# Patient Record
Sex: Male | Born: 1970 | Race: White | Hispanic: No | Smoking: Never smoker
Health system: Southern US, Community
[De-identification: ages and names within clinical notes are randomized; demographics above are authoritative.]

## PROBLEM LIST (undated history)

## (undated) DIAGNOSIS — Z789 Other specified health status: Secondary | ICD-10-CM

## (undated) DIAGNOSIS — K219 Gastro-esophageal reflux disease without esophagitis: Secondary | ICD-10-CM

## (undated) DIAGNOSIS — K859 Acute pancreatitis without necrosis or infection, unspecified: Secondary | ICD-10-CM

## (undated) HISTORY — DX: Acute pancreatitis without necrosis or infection, unspecified: K85.90

## (undated) HISTORY — PX: NO PAST SURGERIES: SHX2092

## (undated) HISTORY — DX: Gastro-esophageal reflux disease without esophagitis: K21.9

---

## 2014-04-24 ENCOUNTER — Encounter (HOSPITAL_COMMUNITY): Payer: Self-pay | Admitting: Emergency Medicine

## 2014-04-24 ENCOUNTER — Inpatient Hospital Stay (HOSPITAL_COMMUNITY): Payer: PRIVATE HEALTH INSURANCE

## 2014-04-24 ENCOUNTER — Inpatient Hospital Stay (HOSPITAL_COMMUNITY)
Admission: EM | Admit: 2014-04-24 | Discharge: 2014-05-04 | DRG: 438 | Disposition: A | Discharge status: Home / Self Care | Payer: PRIVATE HEALTH INSURANCE | Attending: Internal Medicine | Admitting: Internal Medicine

## 2014-04-24 ENCOUNTER — Emergency Department (HOSPITAL_COMMUNITY): Payer: PRIVATE HEALTH INSURANCE

## 2014-04-24 DIAGNOSIS — J90 Pleural effusion, not elsewhere classified: Secondary | ICD-10-CM | POA: Diagnosis present

## 2014-04-24 DIAGNOSIS — K219 Gastro-esophageal reflux disease without esophagitis: Secondary | ICD-10-CM | POA: Diagnosis present

## 2014-04-24 DIAGNOSIS — K858 Other acute pancreatitis without necrosis or infection: Secondary | ICD-10-CM

## 2014-04-24 DIAGNOSIS — J189 Pneumonia, unspecified organism: Secondary | ICD-10-CM | POA: Diagnosis present

## 2014-04-24 DIAGNOSIS — R945 Abnormal results of liver function studies: Secondary | ICD-10-CM

## 2014-04-24 DIAGNOSIS — D72829 Elevated white blood cell count, unspecified: Secondary | ICD-10-CM

## 2014-04-24 DIAGNOSIS — K859 Acute pancreatitis without necrosis or infection, unspecified: Secondary | ICD-10-CM | POA: Diagnosis present

## 2014-04-24 DIAGNOSIS — R739 Hyperglycemia, unspecified: Secondary | ICD-10-CM | POA: Diagnosis present

## 2014-04-24 DIAGNOSIS — R109 Unspecified abdominal pain: Secondary | ICD-10-CM | POA: Diagnosis present

## 2014-04-24 DIAGNOSIS — Z6832 Body mass index (BMI) 32.0-32.9, adult: Secondary | ICD-10-CM | POA: Diagnosis not present

## 2014-04-24 DIAGNOSIS — K5900 Constipation, unspecified: Secondary | ICD-10-CM | POA: Diagnosis present

## 2014-04-24 DIAGNOSIS — J690 Pneumonitis due to inhalation of food and vomit: Secondary | ICD-10-CM | POA: Diagnosis present

## 2014-04-24 DIAGNOSIS — R7989 Other specified abnormal findings of blood chemistry: Secondary | ICD-10-CM | POA: Diagnosis present

## 2014-04-24 HISTORY — DX: Other specified health status: Z78.9

## 2014-04-24 LAB — COMPREHENSIVE METABOLIC PANEL
ALBUMIN: 4 g/dL (ref 3.5–5.2)
ALK PHOS: 92 U/L (ref 39–117)
ALT: 194 U/L — ABNORMAL HIGH (ref 0–53)
ANION GAP: 15 (ref 5–15)
AST: 194 U/L — ABNORMAL HIGH (ref 0–37)
BILIRUBIN TOTAL: 1.7 mg/dL — AB (ref 0.3–1.2)
BUN: 19 mg/dL (ref 6–23)
CHLORIDE: 103 meq/L (ref 96–112)
CO2: 23 meq/L (ref 19–32)
CREATININE: 1.3 mg/dL (ref 0.50–1.35)
Calcium: 9.1 mg/dL (ref 8.4–10.5)
GFR calc Af Amer: 76 mL/min — ABNORMAL LOW (ref >90)
GFR, EST NON AFRICAN AMERICAN: 65 mL/min — AB (ref >90)
Glucose, Bld: 104 mg/dL — ABNORMAL HIGH (ref 70–99)
POTASSIUM: 4.5 meq/L (ref 3.7–5.3)
Sodium: 141 mEq/L (ref 137–147)
Total Protein: 7.1 g/dL (ref 6.0–8.3)

## 2014-04-24 LAB — CBC WITH DIFFERENTIAL/PLATELET
BASOS PCT: 0 % (ref 0–1)
Basophils Absolute: 0 10*3/uL (ref 0.0–0.1)
Eosinophils Absolute: 0 10*3/uL (ref 0.0–0.7)
Eosinophils Relative: 0 % (ref 0–5)
HCT: 47.5 % (ref 39.0–52.0)
HEMOGLOBIN: 17 g/dL (ref 13.0–17.0)
LYMPHS PCT: 4 % — AB (ref 12–46)
Lymphs Abs: 0.7 10*3/uL (ref 0.7–4.0)
MCH: 31.8 pg (ref 26.0–34.0)
MCHC: 35.8 g/dL (ref 30.0–36.0)
MCV: 89 fL (ref 78.0–100.0)
MONO ABS: 1.4 10*3/uL — AB (ref 0.1–1.0)
MONOS PCT: 7 % (ref 3–12)
NEUTROS ABS: 16.7 10*3/uL — AB (ref 1.7–7.7)
NEUTROS PCT: 89 % — AB (ref 43–77)
Platelets: 197 10*3/uL (ref 150–400)
RBC: 5.34 MIL/uL (ref 4.22–5.81)
RDW: 13.2 % (ref 11.5–15.5)
WBC: 18.8 10*3/uL — ABNORMAL HIGH (ref 4.0–10.5)

## 2014-04-24 LAB — URINALYSIS, ROUTINE W REFLEX MICROSCOPIC
Glucose, UA: NEGATIVE mg/dL
HGB URINE DIPSTICK: NEGATIVE
KETONES UR: 15 mg/dL — AB
Leukocytes, UA: NEGATIVE
Nitrite: NEGATIVE
Protein, ur: NEGATIVE mg/dL
SPECIFIC GRAVITY, URINE: 1.021 (ref 1.005–1.030)
Urobilinogen, UA: 1 mg/dL (ref 0.0–1.0)
pH: 5 (ref 5.0–8.0)

## 2014-04-24 LAB — POC OCCULT BLOOD, ED: Fecal Occult Bld: NEGATIVE

## 2014-04-24 LAB — LIPASE, BLOOD

## 2014-04-24 MED ORDER — SODIUM CHLORIDE 0.9 % IV BOLUS (SEPSIS)
1000.0000 mL | Freq: Once | INTRAVENOUS | Status: AC
  Administered 2014-04-24: 1000 mL via INTRAVENOUS

## 2014-04-24 MED ORDER — CHLORHEXIDINE GLUCONATE 0.12 % MT SOLN
15.0000 mL | Freq: Two times a day (BID) | OROMUCOSAL | Status: DC
  Administered 2014-04-24 – 2014-05-02 (×15): 15 mL via OROMUCOSAL
  Filled 2014-04-24 (×21): qty 15

## 2014-04-24 MED ORDER — ONDANSETRON HCL 4 MG/2ML IJ SOLN
4.0000 mg | Freq: Once | INTRAMUSCULAR | Status: AC
  Administered 2014-04-24: 4 mg via INTRAVENOUS
  Filled 2014-04-24: qty 2

## 2014-04-24 MED ORDER — IOHEXOL 300 MG/ML  SOLN
50.0000 mL | Freq: Once | INTRAMUSCULAR | Status: AC | PRN
  Administered 2014-04-24: 50 mL via ORAL

## 2014-04-24 MED ORDER — ONDANSETRON HCL 4 MG/2ML IJ SOLN
4.0000 mg | Freq: Four times a day (QID) | INTRAMUSCULAR | Status: DC | PRN
  Administered 2014-04-24 – 2014-04-29 (×10): 4 mg via INTRAVENOUS
  Filled 2014-04-24 (×11): qty 2

## 2014-04-24 MED ORDER — PANTOPRAZOLE SODIUM 40 MG IV SOLR
40.0000 mg | Freq: Once | INTRAVENOUS | Status: AC
  Administered 2014-04-24: 40 mg via INTRAVENOUS
  Filled 2014-04-24: qty 40

## 2014-04-24 MED ORDER — IOHEXOL 300 MG/ML  SOLN
100.0000 mL | Freq: Once | INTRAMUSCULAR | Status: AC | PRN
  Administered 2014-04-24: 100 mL via INTRAVENOUS

## 2014-04-24 MED ORDER — HYDROMORPHONE HCL 1 MG/ML IJ SOLN
1.0000 mg | INTRAMUSCULAR | Status: DC | PRN
  Administered 2014-04-24 – 2014-04-25 (×3): 1 mg via INTRAVENOUS
  Filled 2014-04-24 (×2): qty 1

## 2014-04-24 MED ORDER — ONDANSETRON HCL 4 MG PO TABS: 4.0000 mg | ORAL_TABLET | Freq: Four times a day (QID) | ORAL | Status: DC | PRN

## 2014-04-24 MED ORDER — PANTOPRAZOLE SODIUM 40 MG IV SOLR
40.0000 mg | INTRAVENOUS | Status: DC
  Administered 2014-04-25 – 2014-05-02 (×9): 40 mg via INTRAVENOUS
  Filled 2014-04-24 (×10): qty 40

## 2014-04-24 MED ORDER — SODIUM CHLORIDE 0.9 % IV SOLN
INTRAVENOUS | Status: DC
  Administered 2014-04-24 – 2014-04-25 (×2): via INTRAVENOUS
  Administered 2014-04-25 (×2): 1000 mL via INTRAVENOUS
  Administered 2014-04-26: 05:00:00 via INTRAVENOUS
  Administered 2014-04-26: 1000 mL via INTRAVENOUS
  Administered 2014-04-26 – 2014-04-27 (×2): via INTRAVENOUS
  Administered 2014-04-27: 1000 mL via INTRAVENOUS
  Administered 2014-04-27 – 2014-04-30 (×5): via INTRAVENOUS
  Administered 2014-05-01: 1000 mL via INTRAVENOUS

## 2014-04-24 MED ORDER — PANTOPRAZOLE SODIUM 40 MG IV SOLR
40.0000 mg | INTRAVENOUS | Status: DC
  Filled 2014-04-24: qty 40

## 2014-04-24 MED ORDER — MORPHINE SULFATE 4 MG/ML IJ SOLN
4.0000 mg | Freq: Once | INTRAMUSCULAR | Status: AC
  Administered 2014-04-24: 4 mg via INTRAVENOUS
  Filled 2014-04-24: qty 1

## 2014-04-24 MED ORDER — HYDROMORPHONE HCL 1 MG/ML IJ SOLN
1.0000 mg | Freq: Once | INTRAMUSCULAR | Status: AC
  Administered 2014-04-24: 1 mg via INTRAVENOUS
  Filled 2014-04-24: qty 1

## 2014-04-24 MED ORDER — SODIUM CHLORIDE 0.9 % IV SOLN: INTRAVENOUS | Status: DC

## 2014-04-24 MED ORDER — CETYLPYRIDINIUM CHLORIDE 0.05 % MT LIQD
7.0000 mL | Freq: Two times a day (BID) | OROMUCOSAL | Status: DC
  Administered 2014-04-25 – 2014-05-01 (×11): 7 mL via OROMUCOSAL

## 2014-04-24 MED ORDER — PANTOPRAZOLE SODIUM 40 MG IV SOLR: 40.0000 mg | INTRAVENOUS | Status: DC

## 2014-04-24 MED ORDER — LEVOFLOXACIN IN D5W 750 MG/150ML IV SOLN
750.0000 mg | INTRAVENOUS | Status: DC
  Administered 2014-04-24 – 2014-04-30 (×7): 750 mg via INTRAVENOUS
  Filled 2014-04-24 (×7): qty 150

## 2014-04-24 MED ORDER — ENOXAPARIN SODIUM 60 MG/0.6ML ~~LOC~~ SOLN
50.0000 mg | SUBCUTANEOUS | Status: DC
  Administered 2014-04-24 – 2014-05-03 (×10): 50 mg via SUBCUTANEOUS
  Filled 2014-04-24 (×11): qty 0.6

## 2014-04-24 MED ORDER — HYDROMORPHONE HCL 1 MG/ML IJ SOLN
1.0000 mg | INTRAMUSCULAR | Status: AC | PRN
  Administered 2014-04-24: 1 mg via INTRAVENOUS
  Filled 2014-04-24 (×3): qty 1

## 2014-04-24 MED ORDER — ONDANSETRON HCL 4 MG/2ML IJ SOLN: 4.0000 mg | Freq: Three times a day (TID) | INTRAMUSCULAR | Status: DC | PRN

## 2014-04-24 NOTE — ED Notes (Signed)
Pt went to x-ray.

## 2014-04-24 NOTE — ED Notes (Signed)
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 history of GERD, second episode of pain made him stop driving his truck and lay on side of road. EMS found him at gas station. Vitals normal pt complained of constant lower abdominal cramps. Vomited once on scene and EMG gave 4 of Zofran.

## 2014-04-24 NOTE — Progress Notes (Signed)
CT scan shows early pneumonia and small left pleural effusion. Will start Levaquin per pharmacy consult.

## 2014-04-24 NOTE — Progress Notes (Signed)
  CARE MANAGEMENT ED NOTE 04/24/2014  Patient:  Philip Pearson,Philip Pearson   Account Number:  [id number]  Date Initiated:  04/24/2014  Documentation initiated by:  Radford PaxFERRERO,Jorgen Wolfinger  Subjective/Objective Assessment:   Patient presents to Ed with abdominal cramping with nausea and vomiting.     Subjective/Objective Assessment Detail:   WBC 18.8.  Lipase >3000     Action/Plan:   Action/Plan Detail:   Anticipated DC Date:       Status Recommendation to Physician:   Result of Recommendation:    Other ED Services  Consult Working Plan    DC Planning Services  Other  PCP issues    Choice offered to / List presented to:            Status of service:  Completed, signed off  ED Comments:   ED Comments Detail:  EDCM spoke to patient at bedside.  Patient confirms he has [place] without pcp.  [facility] with list of [facility]ps who accept [employer] insurnace within a ten  mile radius of patient's zip code [phone].  Patient thankful for resources.  No further EDCM needs at this time.

## 2014-04-24 NOTE — Progress Notes (Signed)
ANTIBIOTIC CONSULT NOTE - INITIAL  Pharmacy Consult for Levaquin Indication: Community Acquired Pneumonia  Allergies  Allergen Reactions  . Codeine Nausea And Vomiting    Patient Measurements: Height: 6' (182.9 cm) Weight: 235 lb (106.595 kg) IBW/kg (Calculated) : 77.6  Vital Signs: Temp: 98.6 F (37 C) (10/09 2153) Temp Source: Oral (10/09 2153) BP: 137/82 mmHg (10/09 2153) Pulse Rate: 80 (10/09 2153) Intake/Output from previous day:   Intake/Output from this shift:    Labs:  Recent Labs  04/24/14 1055  WBC 18.8*  HGB 17.0  PLT 197  CREATININE 1.30   Estimated Creatinine Clearance: 91.5 ml/min (by C-G formula based on Cr of 1.3). No results found for this basename: VANCOTROUGH, VANCOPEAK, VANCORANDOM, GENTTROUGH, GENTPEAK, GENTRANDOM, TOBRATROUGH, TOBRAPEAK, TOBRARND, AMIKACINPEAK, AMIKACINTROU, AMIKACIN,  in the last 72 hours   Microbiology: No results found for this or any previous visit (from the past 720 hour(s)).  Medical History: Past Medical History  Diagnosis Date  . Medical history non-contributory     Medications:  Scheduled:  . [START ON 04/25/2014] antiseptic oral rinse  7 mL Mouth Rinse q12n4p  . chlorhexidine  15 mL Mouth Rinse BID  . enoxaparin (LOVENOX) injection  50 mg Subcutaneous Q24H  . [START ON 04/25/2014] pantoprazole (PROTONIX) IV  40 mg Intravenous Q24H   Infusions:  . sodium chloride 125 mL/hr at 04/24/14 1813   Assessment:  [age] yr male presents with complaint of abdominal pain.  Of note, recently diagnosed with bronchitis and given antibiotics.  Patient being admitted for Acute Pancreatitis  Chest Xray shows early pneumonia  Pharmacy consulted to dose Levaquin for CAP  CrCl ~ 91 ml/min  Goal of Therapy:  Eradication of infection  Plan:  Levaquin 750mg  IV q24h  Blossie Raffel, Joselyn GlassmanLeann Trefz, PharmD 04/24/2014,10:31 PM

## 2014-04-24 NOTE — H&P (Addendum)
PCP:   No primary provider on file.   Chief Complaint:  Abdominal pain  HPI:* 43 year old male with no significant medical problems who came to the hospital with abdominal cramping associated with nausea and vomiting sent last night. Patient says that he developed cramping almost a month ago at that time he took over-the-counter Prevacid and the cramping resolved. Patient also was diagnosed with bronchitis and was given antibiotics with prednisone by local physician. He says that he was doing fine to last night he again developed cramping, which did not improve and continued to get worse. He had 2 episodes of vomiting and this morning he could not walk. Patient is a truck driver and delivers wine bottles, he denies drinking alcohol. The last time he drank beer was on July 4th.  In the ED labs revealed patient had elevated lipase greater than 3000, abdominal ultrasound was done which was negative for gallstones or other biliary pathology. Pancreas and liver could not be observed on the ultrasound due to the bowel gas. He denies chest pain, no shortness of breath no fever no dysuria urgency frequency of urination. Patient does not have previous history of pancreatitis, he thinks that he may have had hyperlipidemia in the past. He has lost almost 100 pounds over past 1 year, after he changed his diet.  Allergies:   Allergies  Allergen Reactions  . Codeine Nausea And Vomiting     History reviewed. No pertinent past medical history.  History reviewed. No pertinent past surgical history.  Prior to Admission medications   Medication Sig Start Date End Date Taking? Authorizing Provider  magnesium oxide (MAG-OX) 400 MG tablet Take 12,000 mg by mouth daily as needed (for constipation).   Yes Historical Provider, MD  omeprazole (PRILOSEC) 40 MG capsule Take 40 mg by mouth daily as needed (for acid reflex).   Yes Historical Provider, MD  ranitidine (ZANTAC) 150 MG tablet Take 150 mg by mouth  daily as needed for heartburn.   Yes Historical Provider, MD    Social History:  reports that he has never smoked. He does not have any smokeless tobacco history on file. He reports that he does not drink alcohol or use illicit drugs.  No family history on file.   All the positives are listed in BOLD  Review of Systems:  HEENT: Headache, blurred vision, runny nose, sore throat Neck: Hypothyroidism, hyperthyroidism,,lymphadenopathy Chest : Shortness of breath, history of COPD, Asthma Heart : Chest pain, history of coronary arterey disease GI:  Nausea, vomiting, diarrhea, constipation, GERD GU: Dysuria, urgency, frequency of urination, hematuria Neuro: Stroke, seizures, syncope Psych: Depression, anxiety, hallucinations   Physical Exam: Blood pressure 133/77, pulse 74, temperature 98.1 F (36.7 C), temperature source Oral, resp. rate 78, height 6' (1.829 m), weight 106.595 kg (235 lb), SpO2 92.00%. Constitutional:   Patient is a well-developed and well-nourished *male in no acute distress and cooperative with exam. Head: Normocephalic and atraumatic Mouth: Mucus membranes moist Eyes: PERRL, EOMI, conjunctivae normal Neck: Supple, No Thyromegaly Cardiovascular: RRR, S1 normal, S2 normal Pulmonary/Chest: CTAB, no wheezes, rales, or rhonchi Abdominal: Soft. Positive epigastric tenderness to palpation. non-distended, bowel sounds are normal, no masses, organomegaly, or guarding present.  Neurological: A&O x3, Strenght is normal and symmetric bilaterally, cranial nerve II-XII are grossly intact, no focal motor deficit, sensory intact to light touch bilaterally.  Extremities : No Cyanosis, Clubbing or Edema  Labs on Admission:  Basic Metabolic Panel:  Recent Labs Lab 04/24/14 1055  NA 141  K  4.5  CL 103  CO2 23  GLUCOSE 104*  BUN 19  CREATININE 1.30  CALCIUM 9.1   Liver Function Tests:  Recent Labs Lab 04/24/14 1055  AST 194*  ALT 194*  ALKPHOS 92  BILITOT 1.7*    PROT 7.1  ALBUMIN 4.0    Recent Labs Lab 04/24/14 1055  LIPASE >3000*   No results found for this basename: AMMONIA,  in the last 168 hours CBC:  Recent Labs Lab 04/24/14 1055  WBC 18.8*  NEUTROABS 16.7*  HGB 17.0  HCT 47.5  MCV 89.0  PLT 197   Cardiac Enzymes: No results found for this basename: CKTOTAL, CKMB, CKMBINDEX, TROPONINI,  in the last 168 hours  BNP (last 3 results) No results found for this basename: PROBNP,  in the last 8760 hours CBG: No results found for this basename: GLUCAP,  in the last 168 hours  Radiological Exams on Admission: [place] Abdomen Complete  04/24/2014   CLINICAL DATA:  Abdominal pain ; pancreatitis and transaminitis  EXAM: ULTRASOUND ABDOMEN COMPLETE ; the study was limited due to the patient's body habitus and bowel gas.  COMPARISON:  None.  FINDINGS: Gallbladder: No gallstones or wall thickening visualized. No sonographic Murphy sign noted.  Common bile duct: Diameter: 2.5 mm  Liver: No focal lesion identified, but the left lobe was obscured by bowel gas. The hepatic echotexture is increased.  IVC: No abnormality visualized.  Pancreas: Obscured by bowel gas.  Spleen: Size and appearance within normal limits.  Right Kidney: Length: 11.6 cm. Echogenicity within normal limits. No mass or hydronephrosis visualized.  Left Kidney: Length: 11.2 cm. Echogenicity within normal limits. There is a simple appearing midpole cyst measuring 4.2 cm in greatest dimension. No hydronephrosis visualized.  Abdominal aorta: Obscured by bowel gas.  Other findings: None.  IMPRESSION: 1. The pancreas and left hepatic lobe were obscured by bowel gas. The observed portions of the liver or normal. The gallbladder exhibits no evidence of stones. 2. There is a simple appearing cyst in the midpole of the left kidney. 3. Further evaluation with abdominal and pelvic CT scanning may be useful in further evaluating the pancreas and liver.   Electronically Signed   By: David  [place]   On:  04/24/2014 14:01   Dg Abd Acute W/chest  04/24/2014   CLINICAL DATA:  Nausea and vomiting for past 9 hr; lower abdominal pain  EXAM: ACUTE ABDOMEN SERIES (ABDOMEN 2 VIEW & CHEST 1 VIEW)  COMPARISON:  None.  FINDINGS: PA chest: There is slight atelectasis in the left base. Elsewhere lungs are clear. Heart size and pulmonary vascularity are normal. No adenopathy.  Supine and upright abdomen: There is a relative paucity of small bowel gas. There is moderate stool in the colon. There is no bowel dilatation or air-fluid level as is typically seen with obstruction. No free air. There is a probable phlebolith in the pelvis.  IMPRESSION: Relative paucity of small bowel gas. This is a finding that may be seen normally but also may be seen with early enteritis or ileus. Obstruction is not felt to be likely. No lung edema or consolidation. There is slight left base atelectasis.   Electronically Signed   By: Bretta BangWilliam  Woodruff M.D.   On: 04/24/2014 11:45       Assessment/Plan Active Problems:   Acute pancreatitis  Acute pancreatitis ? cause Patient has significant elevation of lipase, greater than 3000. We'll keep n.p.o. start IV normal saline at 12 5 ML per hour, will also  check fasting lipid profile to rule out hypertriglyceridemia as a cause of pancreatitis. As the liver and pancreas could not be clearly observed on the ultrasound intravenous CT of the abdomen and pelvis with contrast. Start Dilaudid 1 mg IV every 4 hours when necessary for pain, Zofran 4 mg IV every 6 hours when necessary for nausea vomiting.  GERD  Start IV Protonix 40 mg daily.  DVT prophylaxis Lovenox  Code status: Patient is full code  Family discussion: No family at bedside   Time Spent on Admission: 65 min*  [facility] Pager: [phone] 04/24/2014, 3:27 PM  If 7PM-7AM, please contact night-coverage  [email or web address]  Password [email or web address]

## 2014-04-24 NOTE — ED Notes (Signed)
Patient will let us know when he is able to pee.

## 2014-04-24 NOTE — ED Notes (Signed)
Bed: WA17 Expected date:  Expected time:  Means of arrival:  Comments: EMS-abdominal pain 

## 2014-04-24 NOTE — ED Notes (Signed)
Pt sating in low 90's, 2L via [place] placed on patient

## 2014-04-24 NOTE — ED Provider Notes (Signed)
CSN: [id number]     Arrival date & time 04/24/14  1008 History   First MD Initiated Contact with Patient 04/24/14 1017     Chief Complaint  Patient presents with  . Abdominal Pain     (Consider location/radiation/quality/duration/timing/severity/associated sxs/prior Treatment) HPI Comments: Patient with no past surgical history, recent episode episodes of abdominal cramping diagnosed as GERD, currently being treated with Prevacid -- presents with onset of severe abdominal cramping, generalized but worse upper on exam, and pain with associated nausea and vomiting. Pain began early this morning. He was improved for short time with baking soda. Pain returned. He tried to go to work where he vomited several times. He is a delivery driver. At one point the pain became so severe that he laid himself on the ground. EMS was called and patient was transported to the hospital. Patient denies diarrhea or constipation. He has had darker stools recently but not black stools. No fever. No urinary symptoms. Patient ate his usual breakfast this morning. He denies heavy NSAID use or alcohol use. No history of gallstones.   Patient is a 43 y.o. male presenting with abdominal pain. The history is provided by the patient.  Abdominal Pain Associated symptoms: nausea and vomiting   Associated symptoms: no chest pain, no cough, no diarrhea, no dysuria, no fever and no sore throat     History reviewed. No pertinent past medical history. History reviewed. No pertinent past surgical history. No family history on file. History  Substance Use Topics  . Smoking status: Never Smoker   . Smokeless tobacco: Not on file  . Alcohol Use: No    Review of Systems  Constitutional: Negative for fever.  HENT: Negative for rhinorrhea and sore throat.   Eyes: Negative for redness.  Respiratory: Negative for cough.   Cardiovascular: Negative for chest pain.  Gastrointestinal: Positive for nausea, vomiting and abdominal pain.  Negative for diarrhea.  Genitourinary: Negative for dysuria.  Musculoskeletal: Negative for myalgias.  Skin: Negative for rash.  Neurological: Negative for headaches.   Allergies  Codeine  Home Medications   Prior to Admission medications   Medication Sig Start Date End Date Taking? Authorizing Provider  lansoprazole (PREVACID) 30 MG capsule Take 40 mg by mouth daily at 12 noon.   Yes Historical Provider, MD   BP 127/82  Pulse 66  Resp 12  Ht 6' (1.829 m)  Wt 235 lb (106.595 kg)  BMI 31.86 kg/m2  SpO2 100%  Physical Exam  Nursing note and vitals reviewed. Constitutional: He appears well-developed and well-nourished.  HENT:  Head: Normocephalic and atraumatic.  Eyes: Conjunctivae are normal. Right eye exhibits no discharge. Left eye exhibits no discharge.  Neck: Normal range of motion. Neck supple.  Cardiovascular: Normal rate and regular rhythm.   No murmur heard. Pulmonary/Chest: Effort normal and breath sounds normal. No respiratory distress. He has no wheezes. He has no rales.  Abdominal: Soft. He exhibits no distension. There is tenderness (moderate, periumbilical/epigastric). There is no rebound and no guarding.  Neurological: He is alert.  Skin: Skin is warm and dry.  Psychiatric: He has a normal mood and affect.    ED Course  Procedures (including critical care time) Labs Review Labs Reviewed  CBC WITH DIFFERENTIAL - Abnormal; Notable for the following:    WBC 18.8 (*)    Neutrophils Relative % 89 (*)    Neutro Abs 16.7 (*)    Lymphocytes Relative 4 (*)    Monocytes Absolute 1.4 (*)  All other components within normal limits  COMPREHENSIVE METABOLIC PANEL - Abnormal; Notable for the following:    Glucose, Bld 104 (*)    AST 194 (*)    ALT 194 (*)    Total Bilirubin 1.7 (*)    GFR calc non Af Amer 65 (*)    GFR calc Af Amer 76 (*)    All other components within normal limits  LIPASE, BLOOD - Abnormal; Notable for the following:    Lipase >3000 (*)     All other components within normal limits  URINALYSIS, ROUTINE W REFLEX MICROSCOPIC - Abnormal; Notable for the following:    Color, Urine AMBER (*)    Bilirubin Urine SMALL (*)    Ketones, ur 15 (*)    All other components within normal limits  POC OCCULT BLOOD, ED    Imaging Review US Abdomen Complete  04/24/2014   CLINICAL DATA:  Abdominal pain ; pancreatitis and transaminitis  EXAM: ULTRASOUND ABDOMEN COMPLETE ; the study was limited due to the patient's body habitus and bowel gas.  COMPARISON:  None.  FINDINGS: Gallbladder: No gallstones or wall thickening visualized. No sonographic Murphy sign noted.  Common bile duct: Diameter: 2.5 mm  Liver: No focal lesion identified, but the left lobe was obscured by bowel gas. The hepatic echotexture is increased.  IVC: No abnormality visualized.  Pancreas: Obscured by bowel gas.  Spleen: Size and appearance within normal limits.  Right Kidney: Length: 11.6 cm. Echogenicity within normal limits. No mass or hydronephrosis visualized.  Left Kidney: Length: 11.2 cm. Echogenicity within normal limits. There is a simple appearing midpole cyst measuring 4.2 cm in greatest dimension. No hydronephrosis visualized.  Abdominal aorta: Obscured by bowel gas.  Other findings: None.  IMPRESSION: 1. The pancreas and left hepatic lobe were obscured by bowel gas. The observed portions of the liver or normal. The gallbladder exhibits no evidence of stones. 2. There is a simple appearing cyst in the midpole of the left kidney. 3. Further evaluation with abdominal and pelvic CT scanning may be useful in further evaluating the pancreas and liver.   Electronically Signed   By: David  [place]   On: 04/24/2014 14:01   Dg Abd Acute W/chest  04/24/2014   CLINICAL DATA:  Nausea and vomiting for past 9 hr; lower abdominal pain  EXAM: ACUTE ABDOMEN SERIES (ABDOMEN 2 VIEW & CHEST 1 VIEW)  COMPARISON:  None.  FINDINGS: PA chest: There is slight atelectasis in the left base. Elsewhere  lungs are clear. Heart size and pulmonary vascularity are normal. No adenopathy.  Supine and upright abdomen: There is a relative paucity of small bowel gas. There is moderate stool in the colon. There is no bowel dilatation or air-fluid level as is typically seen with obstruction. No free air. There is a probable phlebolith in the pelvis.  IMPRESSION: Relative paucity of small bowel gas. This is a finding that may be seen normally but also may be seen with early enteritis or ileus. Obstruction is not felt to be likely. No lung edema or consolidation. There is slight left base atelectasis.   Electronically Signed   By: Bretta Bang M.D.   On: 04/24/2014 11:45     EKG Interpretation None      10:55 AM Patient seen and examined. Work-up initiated. Medications ordered.   Vital signs reviewed and are as follows: BP 127/82  Pulse 66  Resp 12  Ht 6' (1.829 m)  Wt 235 lb (106.595 kg)  BMI 31.86  kg/m2  SpO2 100%  1:13 PM Pt discussed with Dr. Fayrene FearingJames. US ordered. Informed patient of results to this point.   2:51 PM Will admit for acute pancreatitis. Pt agrees.   Spoke with Dr. Sharl MaLama who will see.   MDM   Final diagnoses:  Acute pancreatitis, unspecified pancreatitis type   Admit for work up and symptom management of acute pancreatitis.     Renne CriglerJoshua Jorden Mahl, PA-C 04/24/14 1454

## 2014-04-25 DIAGNOSIS — R945 Abnormal results of liver function studies: Secondary | ICD-10-CM

## 2014-04-25 DIAGNOSIS — R7989 Other specified abnormal findings of blood chemistry: Secondary | ICD-10-CM | POA: Diagnosis present

## 2014-04-25 DIAGNOSIS — R739 Hyperglycemia, unspecified: Secondary | ICD-10-CM

## 2014-04-25 DIAGNOSIS — J690 Pneumonitis due to inhalation of food and vomit: Secondary | ICD-10-CM | POA: Diagnosis present

## 2014-04-25 DIAGNOSIS — K859 Acute pancreatitis, unspecified: Principal | ICD-10-CM

## 2014-04-25 DIAGNOSIS — D72829 Elevated white blood cell count, unspecified: Secondary | ICD-10-CM

## 2014-04-25 DIAGNOSIS — J90 Pleural effusion, not elsewhere classified: Secondary | ICD-10-CM | POA: Diagnosis present

## 2014-04-25 DIAGNOSIS — J948 Other specified pleural conditions: Secondary | ICD-10-CM

## 2014-04-25 DIAGNOSIS — J189 Pneumonia, unspecified organism: Secondary | ICD-10-CM

## 2014-04-25 LAB — LIPID PANEL
CHOL/HDL RATIO: 2.8 ratio
CHOLESTEROL: 136 mg/dL (ref 0–200)
HDL: 48 mg/dL (ref >39)
LDL Cholesterol: 78 mg/dL (ref 0–99)
Triglycerides: 49 mg/dL (ref <150)
VLDL: 10 mg/dL (ref 0–40)

## 2014-04-25 LAB — COMPREHENSIVE METABOLIC PANEL
ALBUMIN: 3.5 g/dL (ref 3.5–5.2)
ALT: 121 U/L — ABNORMAL HIGH (ref 0–53)
AST: 64 U/L — AB (ref 0–37)
Alkaline Phosphatase: 77 U/L (ref 39–117)
Anion gap: 11 (ref 5–15)
BUN: 18 mg/dL (ref 6–23)
CALCIUM: 8.3 mg/dL — AB (ref 8.4–10.5)
CHLORIDE: 102 meq/L (ref 96–112)
CO2: 26 mEq/L (ref 19–32)
Creatinine, Ser: 1.23 mg/dL (ref 0.50–1.35)
GFR calc Af Amer: 81 mL/min — ABNORMAL LOW (ref >90)
GFR calc non Af Amer: 70 mL/min — ABNORMAL LOW (ref >90)
Glucose, Bld: 126 mg/dL — ABNORMAL HIGH (ref 70–99)
Potassium: 4.5 mEq/L (ref 3.7–5.3)
Sodium: 139 mEq/L (ref 137–147)
Total Bilirubin: 1 mg/dL (ref 0.3–1.2)
Total Protein: 6.4 g/dL (ref 6.0–8.3)

## 2014-04-25 LAB — HEPATITIS PANEL, ACUTE
HCV Ab: NEGATIVE
Hep A IgM: NONREACTIVE
Hep B C IgM: NONREACTIVE
Hepatitis B Surface Ag: NEGATIVE

## 2014-04-25 LAB — CBC
HCT: 46 % (ref 39.0–52.0)
Hemoglobin: 16 g/dL (ref 13.0–17.0)
MCH: 31.5 pg (ref 26.0–34.0)
MCHC: 34.8 g/dL (ref 30.0–36.0)
MCV: 90.6 fL (ref 78.0–100.0)
PLATELETS: 219 10*3/uL (ref 150–400)
RBC: 5.08 MIL/uL (ref 4.22–5.81)
RDW: 13.6 % (ref 11.5–15.5)
WBC: 19.4 10*3/uL — AB (ref 4.0–10.5)

## 2014-04-25 LAB — HEMOGLOBIN A1C
HEMOGLOBIN A1C: 5.4 % (ref <5.7)
Mean Plasma Glucose: 108 mg/dL (ref <117)

## 2014-04-25 LAB — HIV ANTIBODY (ROUTINE TESTING W REFLEX): HIV 1&2 Ab, 4th Generation: NONREACTIVE

## 2014-04-25 LAB — LIPASE, BLOOD: Lipase: 1755 U/L — ABNORMAL HIGH (ref 11–59)

## 2014-04-25 MED ORDER — ONDANSETRON HCL 4 MG/2ML IJ SOLN
4.0000 mg | Freq: Once | INTRAMUSCULAR | Status: AC
  Administered 2014-04-25: 4 mg via INTRAVENOUS

## 2014-04-25 MED ORDER — HYDROMORPHONE HCL 1 MG/ML IJ SOLN
1.0000 mg | INTRAMUSCULAR | Status: DC | PRN
  Administered 2014-04-25 (×4): 1 mg via INTRAVENOUS
  Filled 2014-04-25 (×4): qty 1

## 2014-04-25 MED ORDER — KETOROLAC TROMETHAMINE 30 MG/ML IJ SOLN
30.0000 mg | Freq: Once | INTRAMUSCULAR | Status: AC | PRN
  Administered 2014-04-26: 30 mg via INTRAVENOUS
  Filled 2014-04-25: qty 1

## 2014-04-25 MED ORDER — HYDROMORPHONE HCL 1 MG/ML IJ SOLN
1.0000 mg | Freq: Once | INTRAMUSCULAR | Status: AC
  Administered 2014-04-25: 1 mg via INTRAVENOUS

## 2014-04-25 MED ORDER — PROMETHAZINE HCL 25 MG/ML IJ SOLN
12.5000 mg | Freq: Four times a day (QID) | INTRAMUSCULAR | Status: DC | PRN
  Administered 2014-04-25 – 2014-04-27 (×3): 12.5 mg via INTRAVENOUS
  Filled 2014-04-25 (×3): qty 1

## 2014-04-25 MED ORDER — HYDROMORPHONE HCL 1 MG/ML IJ SOLN
1.0000 mg | INTRAMUSCULAR | Status: DC | PRN
  Administered 2014-04-25 – 2014-04-29 (×28): 1 mg via INTRAVENOUS
  Filled 2014-04-25 (×29): qty 1

## 2014-04-25 NOTE — Progress Notes (Addendum)
Progress Note   Philip Pearson JXB:[id number] DOB: 17-Jul-1971 DOA: 04/24/2014 PCP: No primary provider on file.   Brief Narrative:   Philip Pearson is an 43 y.o. male with no significant PMH, nondrinker, nonsmoker, who was admitted 04/24/14 with acute pancreatitis of uncertain etiology. Lipase level was 3000 on admission. Abdominal ultrasound was negative for gallstones or ductal dilatation.  Assessment/Plan:   Principal Problem:   Acute pancreatitis  Continue supportive care with bowel rest, IV fluids, pain/nausea medications as needed.  No evidence of gallstone pancreatitis. Patient denies alcohol intake.  Active Problems:   Elevated LFTs  The patient may have passed a stone.  Acute hepatitis serologies negative.  LFTs trending down with hydration.    Hyperglycemia  Check hemoglobin A1c.    Leukocytosis / CAP (community acquired pneumonia) /Pleural effusion, left  Continue empiric Levaquin. Likely an aspiration pneumonia given recent history of nausea/vomiting in the setting of severe pancreatitis.  Unfortunately, blood cultures not sent.  HIV negative.    DVT Prophylaxis  Continue Lovenox.  Code Status: Full. Family Communication: No family at the bedside. Disposition Plan: Home when stable.   IV Access:    Peripheral IV   Procedures and diagnostic studies:   [place] Abdomen Complete 04/24/2014: 1. The pancreas and left hepatic lobe were obscured by bowel gas. The observed portions of the liver or normal. The gallbladder exhibits no evidence of stones. 2. There is a simple appearing cyst in the midpole of the left kidney. 3. Further evaluation with abdominal and pelvic CT scanning may be useful in further evaluating the pancreas and liver.    Ct Abdomen Pelvis W Contrast 04/24/2014: 1. Findings are consistent with acute pancreatitis centered in the distal body and tail. There is peripancreatic fluid with fluid extending into the left paracolic gutter.  There is a tiny amount of free fluid in the pelvis. There is no acute abnormality of the liver or gallbladder or spleen. 2. There is no acute urinary tract abnormality. 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 3. There is no acute bowel abnormality. There are small fat containing right inguinal and umbilical hernias. 4. There is bibasilar atelectasis or early pneumonia with small left pleural effusion.   Electronically Signed   By: David  [place]   On: 04/24/2014 16:53   Dg Abd Acute W/chest 04/24/2014: Relative paucity of small bowel gas. This is a finding that may be seen normally but also may be seen with early enteritis or ileus. Obstruction is not felt to be likely. No lung edema or consolidation. There is slight left base atelectasis.     Medical Consultants:    None.   Other Consultants:    None.   Anti-Infectives:    Levaquin 04/24/14--->  Subjective:   Philip Pearson continues to have severe abdominal pain and nausea. No vomiting. No diarrhea.  Objective:    Filed Vitals:   04/24/14 1735 04/24/14 1759 04/24/14 2153 04/25/14 0609  BP: 130/82 142/88 137/82 134/83  Pulse: 81 80 80 85  Temp:  99 F (37.2 C) 98.6 F (37 C) 98.5 F (36.9 C)  TempSrc:  Oral Oral Oral  Resp:   16 18  Height:  6' (1.829 m)    Weight:  106.595 kg (235 lb)    SpO2: 97% 96% 99% 94%    Intake/Output Summary (Last 24 hours) at 04/25/14 0829 Last data filed at 04/25/14 0610  Gross per 24 hour  Intake  0 ml  Output    400 ml  Net   -400 ml    Exam: Gen:  NAD Cardiovascular:  RRR, No M/R/G Respiratory:  Lungs CTAB Gastrointestinal:  Abdomen soft, tender mid epigastrium, + BS Extremities:  No C/E/C   Data Reviewed:    Labs: Basic Metabolic Panel:  Recent Labs Lab 04/24/14 1055 04/25/14 0420  NA 141 139  K 4.5 4.5  CL 103 102  CO2 23 26  GLUCOSE 104* 126*  BUN 19 18  CREATININE 1.30 1.23  CALCIUM 9.1 8.3*   GFR Estimated Creatinine Clearance: 96.7  ml/min (by C-G formula based on Cr of 1.23). Liver Function Tests:  Recent Labs Lab 04/24/14 1055 04/25/14 0420  AST 194* 64*  ALT 194* 121*  ALKPHOS 92 77  BILITOT 1.7* 1.0  PROT 7.1 6.4  ALBUMIN 4.0 3.5    Recent Labs Lab 04/24/14 1055 04/25/14 0420  LIPASE >3000* 1755*   CBC:  Recent Labs Lab 04/24/14 1055 04/25/14 0420  WBC 18.8* 19.4*  NEUTROABS 16.7*  --   HGB 17.0 16.0  HCT 47.5 46.0  MCV 89.0 90.6  PLT 197 219   Microbiology No results found for this or any previous visit (from the past 240 hour(s)).   Medications:   . antiseptic oral rinse  7 mL Mouth Rinse q12n4p  . chlorhexidine  15 mL Mouth Rinse BID  . enoxaparin (LOVENOX) injection  50 mg Subcutaneous Q24H  . levofloxacin (LEVAQUIN) IV  750 mg Intravenous Q24H  . pantoprazole (PROTONIX) IV  40 mg Intravenous Q24H   Continuous Infusions: . sodium chloride 125 mL/hr at 04/24/14 1813    Time spent: 35 minutes with > 50% of time discussing current diagnostic test results, clinical impression and plan of care.    LOS: 1 day   RAMA,CHRISTINA  [facility] Pager [phone]. If unable to reach me by pager, please call my cell phone at [phone].  *Please refer to [email or web address], password [email or web address] to get updated schedule on who will round on this patient, as hospitalists switch teams weekly. If 7PM-7AM, please contact night-coverage at [email or web address], password [email or web address] for any overnight needs.  04/25/2014, 8:29 AM

## 2014-04-25 NOTE — Plan of Care (Signed)
Problem: Phase I Progression Outcomes Goal: Pain controlled with appropriate interventions Outcome: Progressing Pain decreases with prn dilaudid. (med does have to be given slowly due to "wave of nausea" with it being injected. Patient did vomit once when receiving med when prior to receiving had denied nausea).

## 2014-04-25 NOTE — Progress Notes (Signed)
When pt's vitals were taken, pt's O2 sat was 74. Pt was placed on 2L of oxygen and Sats increased to 94%. Pt was given an incentive spirometer and pt demonstrated use to this RN. Pt's HR was 124 with vitals. NP on call [employer]atherine Schorr notified, dilaudid frequency was decreased to every three hours, and a one time order of toradol if pt needs before next dilaudid PRN.

## 2014-04-25 NOTE — Progress Notes (Signed)
Patient with c/o pain 7-8/10 and with n/v. Too early for prns. Paged NP on call Schorr and orders obtained for one time orders for additional zofran and dilaudid.

## 2014-04-25 NOTE — Progress Notes (Signed)
Patient had 4 episodes of vomiting last night from 1900 to now. All < 100 ml of emesis. +belching/dry heaves as well.

## 2014-04-26 DIAGNOSIS — J690 Pneumonitis due to inhalation of food and vomit: Secondary | ICD-10-CM

## 2014-04-26 LAB — COMPREHENSIVE METABOLIC PANEL
ALK PHOS: 69 U/L (ref 39–117)
ALT: 67 U/L — AB (ref 0–53)
ANION GAP: 10 (ref 5–15)
AST: 27 U/L (ref 0–37)
Albumin: 3 g/dL — ABNORMAL LOW (ref 3.5–5.2)
BUN: 19 mg/dL (ref 6–23)
CO2: 27 meq/L (ref 19–32)
Calcium: 8.3 mg/dL — ABNORMAL LOW (ref 8.4–10.5)
Chloride: 103 mEq/L (ref 96–112)
Creatinine, Ser: 1.28 mg/dL (ref 0.50–1.35)
GFR calc Af Amer: 77 mL/min — ABNORMAL LOW (ref >90)
GFR, EST NON AFRICAN AMERICAN: 67 mL/min — AB (ref >90)
Glucose, Bld: 97 mg/dL (ref 70–99)
Potassium: 4.3 mEq/L (ref 3.7–5.3)
Sodium: 140 mEq/L (ref 137–147)
TOTAL PROTEIN: 6.2 g/dL (ref 6.0–8.3)
Total Bilirubin: 1.1 mg/dL (ref 0.3–1.2)

## 2014-04-26 LAB — CBC
HEMATOCRIT: 45 % (ref 39.0–52.0)
Hemoglobin: 15.2 g/dL (ref 13.0–17.0)
MCH: 31.3 pg (ref 26.0–34.0)
MCHC: 33.8 g/dL (ref 30.0–36.0)
MCV: 92.6 fL (ref 78.0–100.0)
Platelets: 191 10*3/uL (ref 150–400)
RBC: 4.86 MIL/uL (ref 4.22–5.81)
RDW: 13.8 % (ref 11.5–15.5)
WBC: 23.1 10*3/uL — ABNORMAL HIGH (ref 4.0–10.5)

## 2014-04-26 LAB — LIPASE, BLOOD: Lipase: 288 U/L — ABNORMAL HIGH (ref 11–59)

## 2014-04-26 NOTE — Progress Notes (Signed)
Pt c/o increased nausea and pain tonight. PRNs given per MAR. Pt instructed to hold off on oral intake at this time since not tolerating. Eugene GarnetSarah M Taneshia Lorence RN, BSN

## 2014-04-26 NOTE — Plan of Care (Signed)
Problem: Phase I Progression Outcomes Goal: OOB as tolerated unless otherwise ordered Outcome: Completed/Met Date Met:  04/26/14 Patient ambulated in the hallway.Denies SOB,O2 Sat 94% RA.

## 2014-04-26 NOTE — Progress Notes (Addendum)
Progress Note   Philip Pearson [id number] DOB: 03-08-1971 DOA: 04/24/2014 PCP: No primary provider on file.   Brief Narrative:   Philip Pearson is an 43 y.o. male with no significant PMH, nondrinker, nonsmoker, who was admitted 04/24/14 with acute pancreatitis of uncertain etiology. Lipase level was 3000 on admission. Abdominal ultrasound was negative for gallstones or ductal dilatation.  Assessment/Plan:   Principal Problem:   Acute pancreatitis  Continue supportive care with bowel rest, IV fluids, pain/nausea medications as needed.  No evidence of gallstone pancreatitis. Patient denies alcohol intake.  Lipase 288 today.  Advance diet to CL.  Active Problems:   Elevated LFTs  The patient may have passed a stone.  Acute hepatitis serologies negative.  LFTs trending down with hydration.    Hyperglycemia  Hemoglobin A1c 5.4%.    Leukocytosis / Aspiration PNA versus pneumonitis /Pleural effusion, left  Continue empiric Levaquin. Likely an aspiration pneumonia versus pneumonitis given recent history of nausea/vomiting in the setting of severe pancreatitis.  Blood cultures not sent on admission.  HIV negative.    DVT Prophylaxis  Continue Lovenox.  Code Status: Full. Family Communication: No family at the bedside. Disposition Plan: Home when stable.   IV Access:    Peripheral IV   Procedures and diagnostic studies:   [place] Abdomen Complete 04/24/2014: 1. The pancreas and left hepatic lobe were obscured by bowel gas. The observed portions of the liver or normal. The gallbladder exhibits no evidence of stones. 2. There is a simple appearing cyst in the midpole of the left kidney. 3. Further evaluation with abdominal and pelvic CT scanning may be useful in further evaluating the pancreas and liver.    Ct Abdomen Pelvis W Contrast 04/24/2014: 1. Findings are consistent with acute pancreatitis centered in the distal body and tail. There is peripancreatic  fluid with fluid extending into the left paracolic gutter. There is a tiny amount of free fluid in the pelvis. There is no acute abnormality of the liver or gallbladder or spleen. 2. There is no acute urinary tract abnormality. 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 3. There is no acute bowel abnormality. There are small fat containing right inguinal and umbilical hernias. 4. There is bibasilar atelectasis or early pneumonia with small left pleural effusion.   Electronically Signed   By: David  [place]   On: 04/24/2014 16:53   Dg Abd Acute W/chest 04/24/2014: Relative paucity of small bowel gas. This is a finding that may be seen normally but also may be seen with early enteritis or ileus. Obstruction is not felt to be likely. No lung edema or consolidation. There is slight left base atelectasis.     Medical Consultants:    None.   Other Consultants:    None.   Anti-Infectives:    Levaquin 04/24/14--->  Subjective:   Philip Pearson continues to have 6-8/10 abdominal pain and nausea. No vomiting. No diarrhea.  No dyspnea or cough.  Objective:    Filed Vitals:   04/25/14 1611 04/25/14 2108 04/25/14 2113 04/26/14 0544  BP:  143/77  130/77  Pulse:  129 123 97  Temp: 99.8 F (37.7 C) 98 F (36.7 C)  97.8 F (36.6 C)  TempSrc: Oral Oral  Oral  Resp:  20 20 18   Height:      Weight:      SpO2:  78% 94% 92%    Intake/Output Summary (Last 24 hours) at 04/26/14 1206 Last data filed at 04/26/14 1025  Gross per 24 hour  Intake 2837.5 ml  Output   1276 ml  Net 1561.5 ml    Exam: Gen:  NAD Cardiovascular:  RRR, No M/R/G Respiratory:  Lungs CTAB Gastrointestinal:  Abdomen soft, tender mid epigastrium, + BS Extremities:  No C/E/C   Data Reviewed:    Labs: Basic Metabolic Panel:  Recent Labs Lab 04/24/14 1055 04/25/14 0420 04/26/14 0449  NA 141 139 140  K 4.5 4.5 4.3  CL 103 102 103  CO2 23 26 27   GLUCOSE 104* 126* 97  BUN 19 18 19   CREATININE  1.30 1.23 1.28  CALCIUM 9.1 8.3* 8.3*   GFR Estimated Creatinine Clearance: 92.9 ml/min (by C-G formula based on Cr of 1.28). Liver Function Tests:  Recent Labs Lab 04/24/14 1055 04/25/14 0420 04/26/14 0449  AST 194* 64* 27  ALT 194* 121* 67*  ALKPHOS 92 77 69  BILITOT 1.7* 1.0 1.1  PROT 7.1 6.4 6.2  ALBUMIN 4.0 3.5 3.0*    Recent Labs Lab 04/24/14 1055 04/25/14 0420 04/26/14 0449  LIPASE >3000* 1755* 288*   CBC:  Recent Labs Lab 04/24/14 1055 04/25/14 0420 04/26/14 0449  WBC 18.8* 19.4* 23.1*  NEUTROABS 16.7*  --   --   HGB 17.0 16.0 15.2  HCT 47.5 46.0 45.0  MCV 89.0 90.6 92.6  PLT 197 219 191   Microbiology No results found for this or any previous visit (from the past 240 hour(s)).   Medications:   . antiseptic oral rinse  7 mL Mouth Rinse q12n4p  . chlorhexidine  15 mL Mouth Rinse BID  . enoxaparin (LOVENOX) injection  50 mg Subcutaneous Q24H  . levofloxacin (LEVAQUIN) IV  750 mg Intravenous Q24H  . pantoprazole (PROTONIX) IV  40 mg Intravenous Q24H   Continuous Infusions: . sodium chloride 125 mL/hr at 04/26/14 0517    Time spent: 25 minutes.    LOS: 2 days   RAMA,CHRISTINA  [facility] Pager [phone]. If unable to reach me by pager, please call my cell phone at [phone].  *Please refer to [email or web address], password [email or web address] to get updated schedule on who will round on this patient, as hospitalists switch teams weekly. If 7PM-7AM, please contact night-coverage at [email or web address], password [email or web address] for any overnight needs.  04/26/2014, 12:06 PM

## 2014-04-26 NOTE — Progress Notes (Signed)
CARE MANAGEMENT NOTE 04/26/2014  Patient:  Philip Pearson,Philip Pearson   Account Number:  [id number]  Date Initiated:  04/26/2014  Documentation initiated by:  [facility]in[facility]  Subjective/Objective Assessment:   pancreatitis     Action/Plan:   Anticipated DC Date:  04/28/2014   Anticipated DC Plan:  HOME/SELF CARE      DC Planning Services  CM consult      Choice offered to / List presented to:             Status of service:  Completed, signed off Medicare Important Message given?   (If response is "NO", the following Medicare IM given date fields will be blank) Date Medicare IM given:   Medicare IM given by:   Date Additional Medicare IM given:   Additional Medicare IM given by:    Discharge Disposition:  HOME/SELF CARE  Per UR Regulation:    If discussed at Long Length of Stay Meetings, dates discussed:    Comments:  04/26/2014 1520 Referral received to assist pt with finding a PCP. NCM spoke to pt and states he does not have PCP. Provided a [place] of [place] in [place] that accept [place] insurance. Explained to pt list was found on [employer] website. Instructed pt to call to arrange new pt appt.  Isidoro DonningAlesia Arianni Gallego RN CCM Case Mgmt phone [phone]

## 2014-04-27 LAB — COMPREHENSIVE METABOLIC PANEL
ALBUMIN: 2.9 g/dL — AB (ref 3.5–5.2)
ALT: 44 U/L (ref 0–53)
ANION GAP: 9 (ref 5–15)
AST: 22 U/L (ref 0–37)
Alkaline Phosphatase: 69 U/L (ref 39–117)
BILIRUBIN TOTAL: 1.1 mg/dL (ref 0.3–1.2)
BUN: 15 mg/dL (ref 6–23)
CALCIUM: 8.5 mg/dL (ref 8.4–10.5)
CO2: 27 mEq/L (ref 19–32)
CREATININE: 1.05 mg/dL (ref 0.50–1.35)
Chloride: 102 mEq/L (ref 96–112)
GFR calc Af Amer: >90 mL/min (ref >90)
GFR calc non Af Amer: 85 mL/min — ABNORMAL LOW (ref >90)
Glucose, Bld: 109 mg/dL — ABNORMAL HIGH (ref 70–99)
Potassium: 4.6 mEq/L (ref 3.7–5.3)
Sodium: 138 mEq/L (ref 137–147)
Total Protein: 6.3 g/dL (ref 6.0–8.3)

## 2014-04-27 LAB — CBC
HCT: 42 % (ref 39.0–52.0)
Hemoglobin: 14 g/dL (ref 13.0–17.0)
MCH: 30.8 pg (ref 26.0–34.0)
MCHC: 33.3 g/dL (ref 30.0–36.0)
MCV: 92.3 fL (ref 78.0–100.0)
Platelets: 181 10*3/uL (ref 150–400)
RBC: 4.55 MIL/uL (ref 4.22–5.81)
RDW: 13.6 % (ref 11.5–15.5)
WBC: 21.3 10*3/uL — AB (ref 4.0–10.5)

## 2014-04-27 LAB — LIPASE, BLOOD: Lipase: 49 U/L (ref 11–59)

## 2014-04-27 NOTE — Progress Notes (Signed)
Progress Note   Philip Pearson ZOX:[id number] DOB: 1971-04-17 Philip ReadDOA: 04/24/2014 PCP: No primary provider on file.   Brief Narrative:   Philip ReadCharles Pearson is an 43 y.o. male with no significant PMH, nondrinker, nonsmoker, who was admitted 04/24/14 with acute pancreatitis of uncertain etiology. Lipase level was 3000 on admission. Abdominal ultrasound was negative for gallstones or ductal dilatation.  Assessment/Plan:   Principal Problem:   Acute pancreatitis  Continue supportive care with bowel rest, IV fluids, pain/nausea medications as needed.  No evidence of gallstone pancreatitis. Patient denies alcohol intake.  Lipase 49 today.  Did not tolerate advancement of diet to CL yesterday, keep n.p.o. today.  Active Problems:   Elevated LFTs  The patient may have passed a stone.  Acute hepatitis serologies negative.  LFTs trending down with hydration.    Hyperglycemia  Hemoglobin A1c 5.4%.    Leukocytosis / Aspiration PNA versus pneumonitis /Pleural effusion, left  Continue empiric Levaquin. Likely an aspiration pneumonia versus pneumonitis given recent history of nausea/vomiting in the setting of severe pancreatitis. WBC still markedly elevated. Check CBC in the morning.  Blood cultures not sent on admission.  HIV negative.    DVT Prophylaxis  Continue Lovenox.  Code Status: Full. Family Communication: No family at the bedside, called wife and left a message. Disposition Plan: Home when stable.   IV Access:    Peripheral IV   Procedures and diagnostic studies:   [place] Abdomen Complete 04/24/2014: 1. The pancreas and left hepatic lobe were obscured by bowel gas. The observed portions of the liver or normal. The gallbladder exhibits no evidence of stones. 2. There is a simple appearing cyst in the midpole of the left kidney. 3. Further evaluation with abdominal and pelvic CT scanning may be useful in further evaluating the pancreas and liver.    Ct Abdomen  Pelvis W Contrast 04/24/2014: 1. Findings are consistent with acute pancreatitis centered in the distal body and tail. There is peripancreatic fluid with fluid extending into the left paracolic gutter. There is a tiny amount of free fluid in the pelvis. There is no acute abnormality of the liver or gallbladder or spleen. 2. There is no acute urinary tract abnormality. 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 3. There is no acute bowel abnormality. There are small fat containing right inguinal and umbilical hernias. 4. There is bibasilar atelectasis or early pneumonia with small left pleural effusion.   Electronically Signed   By: David  [place]   On: 04/24/2014 16:53   Dg Abd Acute W/chest 04/24/2014: Relative paucity of small bowel gas. This is a finding that may be seen normally but also may be seen with early enteritis or ileus. Obstruction is not felt to be likely. No lung edema or consolidation. There is slight left base atelectasis.     Medical Consultants:    None.   Other Consultants:    None.   Anti-Infectives:    Levaquin 04/24/14--->  Subjective:   Philip Readharles Pearson had recurrence of severe abdominal pain after he drank clear liquids last night. No vomiting or diarrhea. Has some abdominal bloating.  Objective:    Filed Vitals:   04/26/14 0544 04/26/14 1445 04/26/14 2118 04/27/14 0505  BP: 130/77 143/94 152/97 154/97  Pulse: 97 97 111 96  Temp: 97.8 F (36.6 C) 98.2 F (36.8 C) 98.3 F (36.8 C) 98.6 F (37 C)  TempSrc: Oral Oral Oral Oral  Resp: 18 20 20 20   Height:  Weight:      SpO2: 92% 97% 93% 97%    Intake/Output Summary (Last 24 hours) at 04/27/14 0820 Last data filed at 04/27/14 0600  Gross per 24 hour  Intake 3514.58 ml  Output    151 ml  Net 3363.58 ml    Exam: Gen:  NAD Cardiovascular:  RRR, No M/R/G Respiratory:  Lungs CTAB Gastrointestinal:  Abdomen soft, tender mid epigastrium, + BS Extremities:  No C/E/C   Data Reviewed:     Labs: Basic Metabolic Panel:  Recent Labs Lab 04/24/14 1055 04/25/14 0420 04/26/14 0449 04/27/14 0510  NA 141 139 140 138  K 4.5 4.5 4.3 4.6  CL 103 102 103 102  CO2 23 26 27 27   GLUCOSE 104* 126* 97 109*  BUN 19 18 19 15   CREATININE 1.30 1.23 1.28 1.05  CALCIUM 9.1 8.3* 8.3* 8.5   GFR Estimated Creatinine Clearance: 113.3 ml/min (by C-G formula based on Cr of 1.05). Liver Function Tests:  Recent Labs Lab 04/24/14 1055 04/25/14 0420 04/26/14 0449 04/27/14 0510  AST 194* 64* 27 22  ALT 194* 121* 67* 44  ALKPHOS 92 77 69 69  BILITOT 1.7* 1.0 1.1 1.1  PROT 7.1 6.4 6.2 6.3  ALBUMIN 4.0 3.5 3.0* 2.9*    Recent Labs Lab 04/24/14 1055 04/25/14 0420 04/26/14 0449 04/27/14 0510  LIPASE >3000* 1755* 288* 49   CBC:  Recent Labs Lab 04/24/14 1055 04/25/14 0420 04/26/14 0449 04/27/14 0510  WBC 18.8* 19.4* 23.1* 21.3*  NEUTROABS 16.7*  --   --   --   HGB 17.0 16.0 15.2 14.0  HCT 47.5 46.0 45.0 42.0  MCV 89.0 90.6 92.6 92.3  PLT 197 219 191 181   Microbiology No results found for this or any previous visit (from the past 240 hour(s)).   Medications:   . antiseptic oral rinse  7 mL Mouth Rinse q12n4p  . chlorhexidine  15 mL Mouth Rinse BID  . enoxaparin (LOVENOX) injection  50 mg Subcutaneous Q24H  . levofloxacin (LEVAQUIN) IV  750 mg Intravenous Q24H  . pantoprazole (PROTONIX) IV  40 mg Intravenous Q24H   Continuous Infusions: . sodium chloride 125 mL/hr at 04/27/14 0709    Time spent: 25 minutes.    LOS: 3 days   Tyneshia Stivers  [facility] Pager [phone]. If unable to reach me by pager, please call my cell phone at [phone].  *Please refer to [email or web address], password [email or web address] to get updated schedule on who will round on this patient, as hospitalists switch teams weekly. If 7PM-7AM, please contact night-coverage at [email or web address], password [email or web address] for any overnight needs.  04/27/2014, 8:20 AM

## 2014-04-27 NOTE — Progress Notes (Signed)
ANTIBIOTIC CONSULT NOTE - INITIAL  Pharmacy Consult for Levaquin Indication: Community Acquired Pneumonia  Allergies  Allergen Reactions  . Codeine Nausea And Vomiting   Patient Measurements: Height: 6' (182.9 cm) Weight: 235 lb (106.595 kg) IBW/kg (Calculated) : 77.6  Vital Signs: Temp: 98.6 F (37 C) (10/12 0505) Temp Source: Oral (10/12 0505) BP: 154/97 mmHg (10/12 0505) Pulse Rate: 96 (10/12 0505) Intake/Output from previous day: 10/11 0701 - 10/12 0700 In: 3514.6 [P.O.:600; I.V.:2914.6] Out: 151 [Urine:151] Intake/Output from this shift:    Labs:  Recent Labs  04/25/14 0420 04/26/14 0449 04/27/14 0510  WBC 19.4* 23.1* 21.3*  HGB 16.0 15.2 14.0  PLT 219 191 181  CREATININE 1.23 1.28 1.05   Estimated Creatinine Clearance: 113.3 ml/min (by C-G formula based on Cr of 1.05). No results found for this basename: VANCOTROUGH, VANCOPEAK, VANCORANDOM, GENTTROUGH, GENTPEAK, GENTRANDOM, TOBRATROUGH, TOBRAPEAK, TOBRARND, AMIKACINPEAK, AMIKACINTROU, AMIKACIN,  in the last 72 hours   Microbiology: No results found for this or any previous visit (from the past 720 hour(s)).  Medications:  Scheduled:  . antiseptic oral rinse  7 mL Mouth Rinse q12n4p  . chlorhexidine  15 mL Mouth Rinse BID  . enoxaparin (LOVENOX) injection  50 mg Subcutaneous Q24H  . levofloxacin (LEVAQUIN) IV  750 mg Intravenous Q24H  . pantoprazole (PROTONIX) IV  40 mg Intravenous Q24H   Assessment:  [age] yr male presents with complaint of abdominal pain, admit for acute pancreatitis.  Recently diagnosed with bronchitis and given antibiotics.    Chest Xray on admit: early pneumonia, WBC decreasing, afebrile x 24 hr  Pharmacy consulted to dose Levaquin for CAP on 10/9, began with Levaquin 750mg  IV q24hr  Continues with nausea/vomiting, remains NPO  Lipase, LFT's now wnl  Goal of Therapy:  Eradication of infection  Plan:   No change: Levaquin 750mg  IV q24h  Unable to use po abx, consider po  Levaquin when able to take po  Thank you for the consult,  Otho BellowsGreen, Janina Trafton L PharmD Pager [phone] 04/27/2014, 11:30 AM

## 2014-04-28 LAB — CBC
HEMATOCRIT: 40.7 % (ref 39.0–52.0)
Hemoglobin: 14.2 g/dL (ref 13.0–17.0)
MCH: 32.7 pg (ref 26.0–34.0)
MCHC: 34.9 g/dL (ref 30.0–36.0)
MCV: 93.8 fL (ref 78.0–100.0)
Platelets: 190 10*3/uL (ref 150–400)
RBC: 4.34 MIL/uL (ref 4.22–5.81)
RDW: 13.5 % (ref 11.5–15.5)
WBC: 22 10*3/uL — AB (ref 4.0–10.5)

## 2014-04-28 MED ORDER — SIMETHICONE 40 MG/0.6ML PO SUSP
40.0000 mg | Freq: Four times a day (QID) | ORAL | Status: DC | PRN
  Administered 2014-04-28: 40 mg via ORAL
  Filled 2014-04-28 (×2): qty 0.6

## 2014-04-28 MED ORDER — SIMETHICONE 80 MG PO CHEW
160.0000 mg | CHEWABLE_TABLET | Freq: Once | ORAL | Status: AC
  Administered 2014-04-28: 160 mg via ORAL
  Filled 2014-04-28: qty 2

## 2014-04-28 MED ORDER — SIMETHICONE 80 MG PO CHEW
80.0000 mg | CHEWABLE_TABLET | Freq: Four times a day (QID) | ORAL | Status: DC | PRN
  Administered 2014-04-28 – 2014-05-01 (×8): 80 mg via ORAL
  Filled 2014-04-28 (×5): qty 1

## 2014-04-28 NOTE — Progress Notes (Signed)
Progress Note   Philip ReadCharles Dunlevy [id number] DOB: 27-Jul-1970 DOA: 04/24/2014 PCP: No primary provider on file.   Brief Narrative:   Philip Pearson is an 43 y.o. male with no significant PMH, nondrinker, nonsmoker, who was admitted 04/24/14 with acute pancreatitis of uncertain etiology. Lipase level was 3000 on admission. Abdominal ultrasound was negative for gallstones or ductal dilatation.  Assessment/Plan:   Principal Problem:   Acute pancreatitis  Continue supportive care with bowel rest, IV fluids, pain/nausea medications as needed.  No evidence of gallstone pancreatitis. Patient denies alcohol intake.  Lipase normalized 04/27/14.  Advance diet to clear liquids today.  Continue simethicone for gas.  Active Problems:   Elevated LFTs  The patient may have passed a stone.  Acute hepatitis serologies negative.  LFTs trending down with hydration.    Hyperglycemia  Hemoglobin A1c 5.4%.    Leukocytosis / Aspiration PNA versus pneumonitis /Pleural effusion, left  Continue empiric Levaquin. Likely an aspiration pneumonia versus pneumonitis given recent history of nausea/vomiting in the setting of severe pancreatitis. WBC still markedly elevated. Likely from pancreatic inflammation.  Check CBC in the morning.  Blood cultures not sent on admission.  HIV negative.    DVT Prophylaxis  Continue Lovenox.  Code Status: Full. Family Communication: No family at the bedside, called wife and left a message with instructions on how to reach me if she would like to speak with me. Disposition Plan: Home when stable.   IV Access:    Peripheral IV   Procedures and diagnostic studies:   [place] Abdomen Complete 04/24/2014: 1. The pancreas and left hepatic lobe were obscured by bowel gas. The observed portions of the liver or normal. The gallbladder exhibits no evidence of stones. 2. There is a simple appearing cyst in the midpole of the left kidney. 3. Further  evaluation with abdominal and pelvic CT scanning may be useful in further evaluating the pancreas and liver.    Ct Abdomen Pelvis W Contrast 04/24/2014: 1. Findings are consistent with acute pancreatitis centered in the distal body and tail. There is peripancreatic fluid with fluid extending into the left paracolic gutter. There is a tiny amount of free fluid in the pelvis. There is no acute abnormality of the liver or gallbladder or spleen. 2. There is no acute urinary tract abnormality. 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 3. There is no acute bowel abnormality. There are small fat containing right inguinal and umbilical hernias. 4. There is bibasilar atelectasis or early pneumonia with small left pleural effusion.   Electronically Signed   By: David  [place]   On: 04/24/2014 16:53   Dg Abd Acute W/chest 04/24/2014: Relative paucity of small bowel gas. This is a finding that may be seen normally but also may be seen with early enteritis or ileus. Obstruction is not felt to be likely. No lung edema or consolidation. There is slight left base atelectasis.     Medical Consultants:    None.   Other Consultants:    None.   Anti-Infectives:    Levaquin 04/24/14--->  Subjective:   Philip Readharles Didonato says he has some ongoing soreness to his abdomen, pain down to level 4 at worst.  No nausea or vomiting, has bloating and gas, says the simethicone helps.  Objective:    Filed Vitals:   04/27/14 0505 04/27/14 1406 04/27/14 2155 04/28/14 0518  BP: 154/97 153/93 153/97 154/91  Pulse: 96 92 100 91  Temp: 98.6 F (37 C) 98.7 F (  37.1 C) 98.3 F (36.8 C) 98.8 F (37.1 C)  TempSrc: Oral Oral Oral Oral  Resp: 20 20 20 18   Height:      Weight:      SpO2: 97% 94% 94% 98%    Intake/Output Summary (Last 24 hours) at 04/28/14 0827 Last data filed at 04/27/14 1737  Gross per 24 hour  Intake   1075 ml  Output    400 ml  Net    675 ml    Exam: Gen:  NAD Cardiovascular:   RRR, No M/R/G Respiratory:  Lungs CTAB Gastrointestinal:  Abdomen soft, tender mid epigastrium, + BS Extremities:  No C/E/C   Data Reviewed:    Labs: Basic Metabolic Panel:  Recent Labs Lab 04/24/14 1055 04/25/14 0420 04/26/14 0449 04/27/14 0510  NA 141 139 140 138  K 4.5 4.5 4.3 4.6  CL 103 102 103 102  CO2 23 26 27 27   GLUCOSE 104* 126* 97 109*  BUN 19 18 19 15   CREATININE 1.30 1.23 1.28 1.05  CALCIUM 9.1 8.3* 8.3* 8.5   GFR Estimated Creatinine Clearance: 113.3 ml/min (by C-G formula based on Cr of 1.05). Liver Function Tests:  Recent Labs Lab 04/24/14 1055 04/25/14 0420 04/26/14 0449 04/27/14 0510  AST 194* 64* 27 22  ALT 194* 121* 67* 44  ALKPHOS 92 77 69 69  BILITOT 1.7* 1.0 1.1 1.1  PROT 7.1 6.4 6.2 6.3  ALBUMIN 4.0 3.5 3.0* 2.9*    Recent Labs Lab 04/24/14 1055 04/25/14 0420 04/26/14 0449 04/27/14 0510  LIPASE >3000* 1755* 288* 49   CBC:  Recent Labs Lab 04/24/14 1055 04/25/14 0420 04/26/14 0449 04/27/14 0510 04/28/14 0530  WBC 18.8* 19.4* 23.1* 21.3* 22.0*  NEUTROABS 16.7*  --   --   --   --   HGB 17.0 16.0 15.2 14.0 14.2  HCT 47.5 46.0 45.0 42.0 40.7  MCV 89.0 90.6 92.6 92.3 93.8  PLT 197 219 191 181 190   Microbiology No results found for this or any previous visit (from the past 240 hour(s)).   Medications:   . antiseptic oral rinse  7 mL Mouth Rinse q12n4p  . chlorhexidine  15 mL Mouth Rinse BID  . enoxaparin (LOVENOX) injection  50 mg Subcutaneous Q24H  . levofloxacin (LEVAQUIN) IV  750 mg Intravenous Q24H  . pantoprazole (PROTONIX) IV  40 mg Intravenous Q24H   Continuous Infusions: . sodium chloride 1,000 mL (04/27/14 2341)    Time spent: 25 minutes.    LOS: 4 days   RAMA,CHRISTINA  [facility] Pager [phone]. If unable to reach me by pager, please call my cell phone at [phone].  *Please refer to [email or web address], password [email or web address] to get updated schedule on who will round on this patient, as hospitalists switch  teams weekly. If 7PM-7AM, please contact night-coverage at [email or web address], password [email or web address] for any overnight needs.  04/28/2014, 8:27 AM

## 2014-04-29 LAB — CBC
HEMATOCRIT: 40.1 % (ref 39.0–52.0)
HEMOGLOBIN: 13.9 g/dL (ref 13.0–17.0)
MCH: 31.6 pg (ref 26.0–34.0)
MCHC: 34.7 g/dL (ref 30.0–36.0)
MCV: 91.1 fL (ref 78.0–100.0)
Platelets: DECREASED 10*3/uL (ref 150–400)
RBC: 4.4 MIL/uL (ref 4.22–5.81)
RDW: 13.5 % (ref 11.5–15.5)
WBC: 19.9 10*3/uL — ABNORMAL HIGH (ref 4.0–10.5)

## 2014-04-29 MED ORDER — FLEET ENEMA 7-19 GM/118ML RE ENEM: 1.0000 | ENEMA | RECTAL | Status: DC | PRN

## 2014-04-29 MED ORDER — BISACODYL 10 MG RE SUPP: 10.0000 mg | Freq: Once | RECTAL | Status: AC | PRN

## 2014-04-29 MED ORDER — OXYCODONE-ACETAMINOPHEN 5-325 MG PO TABS
1.0000 | ORAL_TABLET | ORAL | Status: DC | PRN
  Administered 2014-04-30: 2 via ORAL
  Administered 2014-04-30: 1 via ORAL
  Administered 2014-04-30 – 2014-05-04 (×11): 2 via ORAL
  Filled 2014-04-29 (×2): qty 2
  Filled 2014-04-29: qty 1
  Filled 2014-04-29 (×10): qty 2

## 2014-04-29 NOTE — Progress Notes (Addendum)
Patient ID: Philip Pearson Obryan, male   DOB: [birth year], 43 y.o.   MRN: [id number] [facility] PROGRESS NOTE  Philip Pearson Becherer WUJ:[id number] DOB: [birth year] DOA: 04/24/2014 PCP: No primary provider on file.  Brief narrative: 43 y.o. male with no significant PMH, nondrinker, nonsmoker, who was admitted 04/24/14 with acute pancreatitis of uncertain etiology. Lipase level was 3000 on admission. Abdominal ultrasound was negative for gallstones or ductal dilatation.   Assessment/Plan:   Principal Problem:  Acute pancreatitis  Findings consistent with acute pancreatitis as seen on CT abdomen on the admission. Lipase level more than 3000 on the admission. AST 194,  ALT 194, ALP WNL, bilirubin 1.7. Diet advanced to full liquid. Patient with some abdominal pain but feels better overall. No further vomiting. May continue IV fluids but reduce the rate to 50 cc an hour. Lipase level is within normal limits on 04/27/2014. Normal liver function enzymes. No evidence of gallstone pancreatitis on abdominal ultrasound. Patient denies alcohol intake.   Active Problems:  Elevated LFTs  The patient may have passed a stone.  Acute hepatitis serologies negative.  LFTs on the admission showed AST 194, ALT 194, bilirubin 1.7. Liver enzymes now normalized.  Hyperglycemia  Hemoglobin A1c 5.4%. Leukocytosis / Aspiration PNA versus pneumonitis /Pleural effusion, left  Continue empiric Levaquin. Likely an aspiration pneumonia versus pneumonitis given recent history of nausea/vomiting in the setting of severe pancreatitis. WBC still markedly elevated but trending down. Likely from pancreatic inflammation.  Blood cultures not sent on admission.  HIV negative. DVT Prophylaxis  Continue Lovenox.   Code Status: Full.  Family Communication: No family at the bedside. Disposition Plan: Home when stable.    IV Access:   Peripheral IV Procedures and diagnostic studies:    [place] Abdomen Complete 04/24/2014: 1. The  pancreas and left hepatic lobe were obscured by bowel gas. The observed portions of the liver or normal. The gallbladder exhibits no evidence of stones. 2. There is a simple appearing cyst in the midpole of the left kidney. 3. Further evaluation with abdominal and pelvic CT scanning may be useful in further evaluating the pancreas and liver.   Ct Abdomen Pelvis W Contrast 04/24/2014: 1. Findings are consistent with acute pancreatitis centered in the distal body and tail. There is peripancreatic fluid with fluid extending into the left paracolic gutter. There is a tiny amount of free fluid in the pelvis. There is no acute abnormality of the liver or gallbladder or spleen. 2. There is no acute urinary tract abnormality. 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 3. There is no acute bowel abnormality. There are small fat containing right inguinal and umbilical hernias. 4. There is bibasilar atelectasis or early pneumonia with small left pleural effusion. Electronically Signed By: David SwazilandJordan On: 04/24/2014 16:53   Dg Abd Acute W/chest 04/24/2014: Relative paucity of small bowel gas. This is a finding that may be seen normally but also may be seen with early enteritis or ileus. Obstruction is not felt to be likely. No lung edema or consolidation. There is slight left base atelectasis.  Medical Consultants:   None. Other Consultants:   None. Anti-Infectives:   Levaquin 04/24/14--->  Manson PasseyEVINE, Mccauley Diehl, MD  [facility] Pager [phone]  If 7PM-7AM, please contact night-coverage [email or web address] Password [email or web address] 04/29/2014, 9:02 AM   LOS: 5 days    HPI/Subjective: No acute overnight events.  Objective: Filed Vitals:   04/28/14 0518 04/28/14 1408 04/28/14 2123 04/29/14 0457  BP: 154/91 150/97 124/92 149/90  Pulse: 91 94 93  86  Temp: 98.8 F (37.1 C) 99.2 F (37.3 C) 100.6 F (38.1 C) 97.7 F (36.5 C)  TempSrc: Oral Oral Oral Oral  Resp: 18 18 16 15   Height:      Weight:      SpO2:  98% 94% 98% 98%    Intake/Output Summary (Last 24 hours) at 04/29/14 0902 Last data filed at 04/29/14 0730  Gross per 24 hour  Intake 3998.34 ml  Output    300 ml  Net 3698.34 ml    Exam:   General:  Pt is alert, follows commands appropriately, not in acute distress  Cardiovascular: Regular rate and rhythm, S1/S2, no murmurs  Respiratory: Clear to auscultation bilaterally, no wheezing, no crackles, no rhonchi  Abdomen: tender in mid abdomen, no guarding, no rebound tenderness, non distended, bowel sounds present  Extremities: No edema, pulses DP and PT palpable bilaterally  Neuro: Grossly nonfocal  Data Reviewed: Basic Metabolic Panel:  Recent Labs Lab 04/24/14 1055 04/25/14 0420 04/26/14 0449 04/27/14 0510  NA 141 139 140 138  K 4.5 4.5 4.3 4.6  CL 103 102 103 102  CO2 23 26 27 27   GLUCOSE 104* 126* 97 109*  BUN 19 18 19 15   CREATININE 1.30 1.23 1.28 1.05  CALCIUM 9.1 8.3* 8.3* 8.5   Liver Function Tests:  Recent Labs Lab 04/24/14 1055 04/25/14 0420 04/26/14 0449 04/27/14 0510  AST 194* 64* 27 22  ALT 194* 121* 67* 44  ALKPHOS 92 77 69 69  BILITOT 1.7* 1.0 1.1 1.1  PROT 7.1 6.4 6.2 6.3  ALBUMIN 4.0 3.5 3.0* 2.9*    Recent Labs Lab 04/24/14 1055 04/25/14 0420 04/26/14 0449 04/27/14 0510  LIPASE >3000* 1755* 288* 49   No results found for this basename: AMMONIA,  in the last 168 hours CBC:  Recent Labs Lab 04/24/14 1055 04/25/14 0420 04/26/14 0449 04/27/14 0510 04/28/14 0530 04/29/14 0635  WBC 18.8* 19.4* 23.1* 21.3* 22.0* 19.9*  NEUTROABS 16.7*  --   --   --   --   --   HGB 17.0 16.0 15.2 14.0 14.2 13.9  HCT 47.5 46.0 45.0 42.0 40.7 40.1  MCV 89.0 90.6 92.6 92.3 93.8 91.1  PLT 197 219 191 181 190 PLATELET CLUMPS NOTED ON SMEAR, COUNT APPEARS DECREASED   Cardiac Enzymes: No results found for this basename: CKTOTAL, CKMB, CKMBINDEX, TROPONINI,  in the last 168 hours BNP: No components found with this basename: POCBNP,  CBG: No  results found for this basename: GLUCAP,  in the last 168 hours  No results found for this or any previous visit (from the past 240 hour(s)).   Scheduled Meds: . enoxaparin (LOVENOX) injection  50 mg Subcutaneous Q24H  . levofloxacin (LEVAQUIN)   750 mg Intravenous Q24H  . pantoprazole (PROTONIX) IV  40 mg Intravenous Q24H   Continuous Infusions: . sodium chloride 125 mL/hr at 04/28/14 1517

## 2014-04-30 LAB — CBC
HEMATOCRIT: 41 % (ref 39.0–52.0)
Hemoglobin: 14 g/dL (ref 13.0–17.0)
MCH: 31.1 pg (ref 26.0–34.0)
MCHC: 34.1 g/dL (ref 30.0–36.0)
MCV: 91.1 fL (ref 78.0–100.0)
Platelets: 188 10*3/uL (ref 150–400)
RBC: 4.5 MIL/uL (ref 4.22–5.81)
RDW: 13.3 % (ref 11.5–15.5)
WBC: 19.1 10*3/uL — AB (ref 4.0–10.5)

## 2014-04-30 MED ORDER — MAGNESIUM CITRATE PO SOLN
1.0000 | Freq: Once | ORAL | Status: AC
  Administered 2014-04-30: 1 via ORAL
  Filled 2014-04-30: qty 296

## 2014-04-30 MED ORDER — SENNOSIDES-DOCUSATE SODIUM 8.6-50 MG PO TABS
1.0000 | ORAL_TABLET | Freq: Every day | ORAL | Status: DC
  Filled 2014-04-30 (×5): qty 1

## 2014-04-30 NOTE — Progress Notes (Signed)
ANTIBIOTIC CONSULT NOTE - Follow up  Pharmacy Consult for Levaquin Indication: Community Acquired Pneumonia  Allergies  Allergen Reactions  . Codeine Nausea And Vomiting   Patient Measurements: Height: 6' (182.9 cm) Weight: 235 lb (106.595 kg) IBW/kg (Calculated) : 77.6  Assessment: 43 yr male admitted 10/9 with complaint of abdominal pain. Of note, recently diagnosed with bronchitis and given antibiotics. Patient being admitted for acute pancreatitis. Chest Xray shows early pneumonia and Pharmacy is consulted to dose levofloxacin  10/9 CT abd shows bibasilar atelectasis or early pna with small L pleural effusion.  Antiinfectives 10/9 >> levofloxacin >>  Labs / vitals Tmax: now afebrile (last fever 100.6 on 10/13) WBCs: remains elevated Renal: SCr 1.05 on 10/12, CrCl >100  No micro data.  UA from 10/9 negative for UTI Hepatitis panel negative HIV antibody nonreactive  10/15: D7 Levaquin 750mg  IV q24h for CAP, but now considering aspiration pneumonia given recent h/o N/V. If considering aspiration pneumonia, then empiric Levaquin not best coverage, left MDSN on 10/14.   Goal of Therapy:  Eradication of infection  Plan:  - continue levofloxacin 750mg  Iv q24h - patient still unable to take PO medications, will continue IV formulation of levofloxacin - follow-up clinical course, renal function - follow-up antibiotic escalation and length of therapy - CBC and BMET in AM  Thank you for the consult.  Ross LudwigJesse Yula Crotwell Akers, PharmD, BCPS Pager: [phone] Pharmacy: [phone] 04/30/2014 10:49 AM

## 2014-04-30 NOTE — Progress Notes (Signed)
Patient ID: Philip Pearson Coole, male   DOB: [birth year], 43 y.o.   MRN: [id number] [facility] PROGRESS NOTE  Philip Pearson Creveling WUJ:[id number] DOB: [birth year] DOA: 04/24/2014 PCP: No primary provider on file.  Brief narrative: 43 y.o. male with no significant PMH, nondrinker, nonsmoker, who was admitted 04/24/14 with acute pancreatitis of uncertain etiology. Lipase level was 3000 on admission. Abdominal ultrasound was negative for gallstones or ductal dilatation.   Assessment/Plan:   Principal Problem:  Acute pancreatitis  Findings consistent with acute pancreatitis as seen on CT abdomen on the admission. Lipase level more than 3000 on the admission. AST 194, ALT 194, ALP WNL, bilirubin 1.7.  Feels better this am. Diet regular today. Lipase level is within normal limits on 04/27/2014. Normal liver function enzymes.  No evidence of gallstone pancreatitis on abdominal ultrasound. Patient denied alcohol intake.  Active Problems:  Elevated LFTs  The patient may have passed a stone.  Acute hepatitis serologies negative.  LFTs on the admission showed AST 194, ALT 194, bilirubin 1.7. Liver enzymes now normalized.  Constipation  Add magnesium citrate, senna. Had 1 BM today. Hyperglycemia  Hemoglobin A1c 5.4%. Leukocytosis / Aspiration PNA versus pneumonitis /Pleural effusion, left  Continue empiric Levaquin. Likely an aspiration pneumonia versus pneumonitis given recent history of nausea/vomiting in the setting of severe pancreatitis. WBC still markedly elevated but trending down. Likely from pancreatic inflammation.  Blood cultures not sent on admission.  HIV negative. DVT Prophylaxis  Continue Lovenox.   Code Status: Full.  Family Communication: No family at the bedside.  Disposition Plan: Home when stable.    IV Access:   Peripheral IV  Procedures and diagnostic studies:    [place] Abdomen Complete 04/24/2014: 1. The pancreas and left hepatic lobe were obscured by bowel gas. The  observed portions of the liver or normal. The gallbladder exhibits no evidence of stones. 2. There is a simple appearing cyst in the midpole of the left kidney. 3. Further evaluation with abdominal and pelvic CT scanning may be useful in further evaluating the pancreas and liver.   Ct Abdomen Pelvis W Contrast 04/24/2014: 1. Findings are consistent with acute pancreatitis centered in the distal body and tail. There is peripancreatic fluid with fluid extending into the left paracolic gutter. There is a tiny amount of free fluid in the pelvis. There is no acute abnormality of the liver or gallbladder or spleen. 2. There is no acute urinary tract abnormality. 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 3. There is no acute bowel abnormality. There are small fat containing right inguinal and umbilical hernias. 4. There is bibasilar atelectasis or early pneumonia with small left pleural effusion. Electronically Signed By: David SwazilandJordan On: 04/24/2014 16:53   Dg Abd Acute W/chest 04/24/2014: Relative paucity of small bowel gas. This is a finding that may be seen normally but also may be seen with early enteritis or ileus. Obstruction is not felt to be likely. No lung edema or consolidation. There is slight left base atelectasis.   Medical Consultants:   None.  Other Consultants:   None.  Anti-Infectives:   Levaquin 04/24/14--->   Manson PasseyEVINE, Antonetta Clanton, MD  [facility] Pager [phone]  If 7PM-7AM, please contact night-coverage [email or web address] Password [email or web address] 04/30/2014, 3:27 PM   LOS: 6 days    HPI/Subjective: No acute overnight events.  Objective: Filed Vitals:   04/29/14 2050 04/29/14 2056 04/30/14 0512 04/30/14 1347  BP:  133/82 134/84 148/94  Pulse: 88 88 86 95  Temp:  98.1 F (36.7  C) 98.5 F (36.9 C) 100.2 F (37.9 C)  TempSrc:  Oral Oral Oral  Resp:  16 18 18   Height:      Weight:      SpO2:  98% 98% 100%    Intake/Output Summary (Last 24 hours) at 04/30/14 1527 Last  data filed at 04/30/14 1358  Gross per 24 hour  Intake 1970.67 ml  Output    825 ml  Net 1145.67 ml    Exam:   General:  Pt is alert, follows commands appropriately, not in acute distress  Cardiovascular: Regular rate and rhythm, S1/S2, no murmurs  Respiratory: no wheezing, no crackles, no rhonchi  Abdomen: non tender, non distended, bowel sounds present  Extremities: pulses DP and PT palpable bilaterally  Neuro: Non focal  Data Reviewed: Basic Metabolic Panel:  Recent Labs Lab 04/24/14 1055 04/25/14 0420 04/26/14 0449 04/27/14 0510  NA 141 139 140 138  K 4.5 4.5 4.3 4.6  CL 103 102 103 102  CO2 23 26 27 27   GLUCOSE 104* 126* 97 109*  BUN 19 18 19 15   CREATININE 1.30 1.23 1.28 1.05  CALCIUM 9.1 8.3* 8.3* 8.5   Liver Function Tests:  Recent Labs Lab 04/24/14 1055 04/25/14 0420 04/26/14 0449 04/27/14 0510  AST 194* 64* 27 22  ALT 194* 121* 67* 44  ALKPHOS 92 77 69 69  BILITOT 1.7* 1.0 1.1 1.1  PROT 7.1 6.4 6.2 6.3  ALBUMIN 4.0 3.5 3.0* 2.9*    Recent Labs Lab 04/24/14 1055 04/25/14 0420 04/26/14 0449 04/27/14 0510  LIPASE >3000* 1755* 288* 49   No results found for this basename: AMMONIA,  in the last 168 hours CBC:  Recent Labs Lab 04/24/14 1055 04/26/14 0449 04/27/14 0510 04/28/14 0530 04/29/14 0635 04/30/14 1100  WBC 18.8* 23.1* 21.3* 22.0* 19.9* 19.1*  NEUTROABS 16.7*  --   --   --   --   --   HGB 17.0 15.2 14.0 14.2 13.9 14.0  HCT 47.5 45.0 42.0 40.7 40.1 41.0  MCV 89.0 92.6 92.3 93.8 91.1 91.1  PLT 197 191 181 190  188  . Cardiac Enzymes: No results found for this basename: CKTOTAL, CKMB, CKMBINDEX, TROPONINI,  in the last 168 hours BNP: No components found with this basename: POCBNP,  CBG: No results found for this basename: GLUCAP,  in the last 168 hours  No results found for this or any previous visit (from the past 240 hour(s)).   Scheduled Meds: . enoxaparin (LOVENOX) injection  50 mg Subcutaneous Q24H  .  levofloxacin (LEVAQUIN)   750 mg Intravenous Q24H  . pantoprazole (PROTONIX) IV  40 mg Intravenous Q24H   Continuous Infusions: . sodium chloride 50 mL/hr at 04/30/14 1044

## 2014-04-30 NOTE — Progress Notes (Signed)
Patient reports very large bowel movement following mag citrate.

## 2014-04-30 NOTE — Plan of Care (Signed)
Problem: Phase III Progression Outcomes Goal: Amylase, WBC trending downward Outcome: Progressing WBC remains elevated

## 2014-04-30 NOTE — Plan of Care (Signed)
Problem: Phase III Progression Outcomes Goal: Tolerating diet Outcome: Progressing Patient without vomiting with advancement to full liquids. +bloating. Very small stools today per pt still feels constipation. PRN orders obtained.

## 2014-05-01 LAB — CREATININE, SERUM
Creatinine, Ser: 0.93 mg/dL (ref 0.50–1.35)
GFR calc non Af Amer: >90 mL/min (ref >90)

## 2014-05-01 LAB — TSH: TSH: 1.82 u[IU]/mL (ref 0.350–4.500)

## 2014-05-01 LAB — CBC
HEMATOCRIT: 38.4 % — AB (ref 39.0–52.0)
HEMOGLOBIN: 13.4 g/dL (ref 13.0–17.0)
MCH: 31.5 pg (ref 26.0–34.0)
MCHC: 34.9 g/dL (ref 30.0–36.0)
MCV: 90.4 fL (ref 78.0–100.0)
Platelets: 206 10*3/uL (ref 150–400)
RBC: 4.25 MIL/uL (ref 4.22–5.81)
RDW: 13.4 % (ref 11.5–15.5)
WBC: 21.5 10*3/uL — ABNORMAL HIGH (ref 4.0–10.5)

## 2014-05-01 MED ORDER — PIPERACILLIN-TAZOBACTAM 3.375 G IVPB
3.3750 g | Freq: Three times a day (TID) | INTRAVENOUS | Status: DC
  Administered 2014-05-01 – 2014-05-04 (×9): 3.375 g via INTRAVENOUS
  Filled 2014-05-01 (×10): qty 50

## 2014-05-01 MED ORDER — MAGNESIUM CITRATE PO SOLN
1.0000 | Freq: Once | ORAL | Status: AC
  Administered 2014-05-01: 1 via ORAL
  Filled 2014-05-01: qty 296

## 2014-05-01 NOTE — Progress Notes (Addendum)
Patient ID: Philip Pearson, male   DOB: December 16, 1970, 43 y.o.   MRN: [id number] [facility] PROGRESS NOTE  Philip ReadCharles Paladino WUJ:[id number] DOB: December 16, 1970 DOA: 04/24/2014 PCP: No primary provider on file.  Brief narrative: 43 y.o. male with no significant PMH, nondrinker, nonsmoker, who was admitted 04/24/14 with acute pancreatitis of uncertain etiology. Lipase level was 3000 on admission. Abdominal ultrasound was negative for gallstones or ductal dilatation. Hospital course is complicated with development of possible aspiration pneumonia for which reason patient was started on Levaquin.  Assessment/Plan:   Principal Problem:  Acute pancreatitis  Findings consistent with acute pancreatitis as seen on CT abdomen on the admission. Lipase level more than 3000 on the admission. AST 194, ALT 194, ALP WNL, bilirubin 1.7.  Patient continues to feel better but he reports more abdominal bloating this morning. He is a regular diet. So far tolerates well. Lipase level is within normal limits on 04/27/2014. Normal liver function enzymes.  No evidence of gallstone pancreatitis on abdominal ultrasound. Patient denied alcohol intake.  Active Problems:  Elevated LFTs  The patient may have passed a stone.  Acute hepatitis serologies negative.  LFTs on the admission showed AST 194, ALT 194, bilirubin 1.7. Liver enzymes now normalized.  Constipation   Patient had one bowel movement in past 24 hours with mag citrate. We'll add additional dose of mag citrate today.  Hyperglycemia  Hemoglobin A1c 5.4%. Leukocytosis / Aspiration PNA versus pneumonitis /Pleural effusion, left / Pancreatitis  Likely an aspiration pneumonia versus pneumonitis given recent history of nausea/vomiting in the setting of severe pancreatitis. WBC still markedly elevated which is likely from pancreatic inflammation.  Will change ABX from Levaquin to Zosyn 10/16 as pt still with fever, Tmax 100.2 F and WBC trending up.  Blood  cultures not sent on admission.  HIV negative. DVT Prophylaxis  Continue Lovenox.  Code Status: Full.  Family Communication: No family at the bedside.  Disposition Plan: Home when stable.   IV Access:   Peripheral IV Procedures and diagnostic studies:    [place] Abdomen Complete 04/24/2014: 1. The pancreas and left hepatic lobe were obscured by bowel gas. The observed portions of the liver or normal. The gallbladder exhibits no evidence of stones. 2. There is a simple appearing cyst in the midpole of the left kidney. 3. Further evaluation with abdominal and pelvic CT scanning may be useful in further evaluating the pancreas and liver.   Ct Abdomen Pelvis W Contrast 04/24/2014: 1. Findings are consistent with acute pancreatitis centered in the distal body and tail. There is peripancreatic fluid with fluid extending into the left paracolic gutter. There is a tiny amount of free fluid in the pelvis. There is no acute abnormality of the liver or gallbladder or spleen. 2. There is no acute urinary tract abnormality. 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 3. There is no acute bowel abnormality. There are small fat containing right inguinal and umbilical hernias. 4. There is bibasilar atelectasis or early pneumonia with small left pleural effusion. Electronically Signed By: David SwazilandJordan On: 04/24/2014 16:53   Dg Abd Acute W/chest 04/24/2014: Relative paucity of small bowel gas. This is a finding that may be seen normally but also may be seen with early enteritis or ileus. Obstruction is not felt to be likely. No lung edema or consolidation. There is slight left base atelectasis.   Medical Consultants:   None.   Other Consultants:   None.   Anti-Infectives:   Levaquin 04/24/14---> 05/01/14 Zosyn 05/01/14 -->  Manson PasseyEVINE, Philip Boxell, Philip Pearson  [facility] Pager [phone]  If 7PM-7AM, please contact night-coverage [email or web address] Password [email or web address] 05/01/2014, 10:47 AM   LOS: 7 days     HPI/Subjective: No acute overnight events.  Objective: Filed Vitals:   04/30/14 0512 04/30/14 1347 04/30/14 2350 05/01/14 0545  BP: 134/84 148/94 128/68 118/77  Pulse: 86 95 93 70  Temp: 98.5 F (36.9 C) 100.2 F (37.9 C) 99.5 F (37.5 C) 98.2 F (36.8 C)  TempSrc: Oral Oral Oral Oral  Resp: 18 18 20 20   Height:      Weight:      SpO2: 98% 100% 95% 93%    Intake/Output Summary (Last 24 hours) at 05/01/14 1047 Last data filed at 04/30/14 1900  Gross per 24 hour  Intake 1336.67 ml  Output    125 ml  Net 1211.67 ml    Exam:   General:  Pt is alert, follows commands appropriately, not in acute distress  Cardiovascular: Regular rate and rhythm, S1/S2, no murmurs  Respiratory: Bilateral air entry, no wheezing  Abdomen: Softly distended, no rebound tenderness, no guarding, appreciated bowel sounds   Extremities: No edema, pulses DP and PT palpable bilaterally  Neuro: No focal neurological deficits  Data Reviewed: Basic Metabolic Panel:  Recent Labs Lab 04/24/14 1055 04/25/14 0420 04/26/14 0449 04/27/14 0510 05/01/14 0450  NA 141 139 140 138  --   K 4.5 4.5 4.3 4.6  --   CL 103 102 103 102  --   CO2 23 26 27 27   --   GLUCOSE 104* 126* 97 109*  --   BUN 19 18 19 15   --   CREATININE 1.30 1.23 1.28 1.05 0.93  CALCIUM 9.1 8.3* 8.3* 8.5  --    Liver Function Tests:  Recent Labs Lab 04/24/14 1055 04/25/14 0420 04/26/14 0449 04/27/14 0510  AST 194* 64* 27 22  ALT 194* 121* 67* 44  ALKPHOS 92 77 69 69  BILITOT 1.7* 1.0 1.1 1.1  PROT 7.1 6.4 6.2 6.3  ALBUMIN 4.0 3.5 3.0* 2.9*    Recent Labs Lab 04/24/14 1055 04/25/14 0420 04/26/14 0449 04/27/14 0510  LIPASE >3000* 1755* 288* 49   CBC:  Recent Labs Lab 04/24/14 1055  04/27/14 0510 04/28/14 0530 04/29/14 0635 04/30/14 1100 05/01/14 0450  WBC 18.8*  < > 21.3* 22.0* 19.9* 19.1* 21.5*  NEUTROABS 16.7*  --   --   --   --   --   --   HGB 17.0  < > 14.0 14.2 13.9 14.0 13.4  HCT 47.5  < >  42.0 40.7 40.1 41.0 38.4*  MCV 89.0  < > 92.3 93.8 91.1 91.1 90.4  PLT 197  < > 181 190 PLATELET CLUMPS NOTED ON SMEAR, COUNT APPEARS DECREASED 188 206  < > = values in this interval not displayed.  Scheduled Meds: . enoxaparin (LOVENOX) injection  50 mg Subcutaneous Q24H  . levofloxacin (LEVAQUIN) IV  750 mg Intravenous Q24H  . pantoprazole (PROTONIX) IV  40 mg Intravenous Q24H  . senna-docusate  1 tablet Oral QHS   Continuous Infusions: . sodium chloride 10 mL/hr at 05/01/14 0950

## 2014-05-01 NOTE — ED Provider Notes (Signed)
Medical screening examination/treatment/procedure(s) were conducted as a shared visit with non-physician practitioner(s) and myself.  I personally evaluated the patient during the encounter.   EKG Interpretation None      Patient seen examined. Discussed with PA. Patient with abdominal pain for the last few days. Worsening today. Tenderness in the upper abdomen. No peritoneal irritation. Elevated lipase. Recommend ultrasound to rule out obstructive cause. I agree with admission. Patient's symptoms improving with IV pain medications.  Rolland PorterMark Riyaan Heroux, MD 05/01/14 [phone]

## 2014-05-01 NOTE — Progress Notes (Signed)
ANTIBIOTIC CONSULT NOTE - Follow up  Pharmacy Consult for Zosyn Indication: CAP vs Aspiration PNA vs Pancreatitis  Allergies  Allergen Reactions  . Codeine Nausea And Vomiting   Patient Measurements: Height: 6' (182.9 cm) Weight: 235 lb (106.595 kg) IBW/kg (Calculated) : 77.6  Assessment: [age] yr male admitted 10/9 with complaint of abdominal pain. Of note, recently diagnosed with bronchitis and given antibiotics. Patient being admitted for acute pancreatitis. Chest Xray shows early pneumonia and Pharmacy was consulted to dose Levofloxacin 10/9.  10/9 CT abd shows bibasilar atelectasis or early pna with small L pleural effusion.  Antiinfectives 10/9 >> levofloxacin >> 10/16 10/16 >> Zosyn >>  Labs / vitals Tmax: continues spiking low grade temps WBCs: increased at 21.5K Renal: SCr 0.93, wnl  No micro data.  UA from 10/9 negative for UTI Hepatitis panel negative HIV antibody nonreactive  10/16: D8 antibiotics, 7 days Levaquin for susptected CAP, but also considering aspiration pneumonia given recent h/o N/V. Switch to Zosyn with continued temps, leukocytosis  Goal of Therapy:  Eradication of infection  Plan:   Begin Zosyn 3.375gm q8hr-4 hr infusion   Thank you for the consult,  Otho BellowsGreen, Zeth Buday L PharmD Pager [phone] 05/01/2014, 11:20 AM

## 2014-05-01 NOTE — Plan of Care (Signed)
Problem: Phase III Progression Outcomes Goal: Other Phase III Outcomes/Goals Outcome: Progressing Patient had 2 medium BM's after magnesium citrate was administered.

## 2014-05-01 NOTE — Plan of Care (Signed)
Problem: Phase III Progression Outcomes Goal: Pain controlled on oral analgesia Outcome: Progressing Patient pain level of 1, on oral pain med- oxycodone acetaminophen 5/325 mg

## 2014-05-02 LAB — CBC
HEMATOCRIT: 40.3 % (ref 39.0–52.0)
Hemoglobin: 13.3 g/dL (ref 13.0–17.0)
MCH: 30.3 pg (ref 26.0–34.0)
MCHC: 33 g/dL (ref 30.0–36.0)
MCV: 91.8 fL (ref 78.0–100.0)
Platelets: 235 10*3/uL (ref 150–400)
RBC: 4.39 MIL/uL (ref 4.22–5.81)
RDW: 13.6 % (ref 11.5–15.5)
WBC: 17.7 10*3/uL — ABNORMAL HIGH (ref 4.0–10.5)

## 2014-05-02 MED ORDER — FUROSEMIDE 10 MG/ML IJ SOLN
20.0000 mg | Freq: Once | INTRAMUSCULAR | Status: AC
  Administered 2014-05-02: 20 mg via INTRAVENOUS
  Filled 2014-05-02: qty 2

## 2014-05-02 NOTE — Progress Notes (Signed)
Patient ID: Philip Pearson, male   DOB: 02-05-1971, 43 y.o.   MRN: [id number] [facility] PROGRESS NOTE  Philip Pearson JXB:[id number] DOB: 02-05-1971 DOA: 04/24/2014 PCP: No primary provider on file.  Brief narrative: 43 y.o. male with no significant PMH, nondrinker, nonsmoker, who was admitted 04/24/14 with acute pancreatitis of uncertain etiology. Lipase level was 3000 on admission. Abdominal ultrasound was negative for gallstones or ductal dilatation. Hospital course was complicated with development of possible aspiration pneumonia for which reason patient was started on Levaquin. He continued to spike low grade fever and has had persistent leukocytosis. We switched Levaquin to zosyn and noted improvement clinically and on blood work.  Assessment/Plan:   Principal Problem:  Acute pancreatitis   Findings consistent with acute pancreatitis as seen on CT abdomen on the admission. Lipase level more than 3000 on the admission. AST 194, ALT 194, ALP WNL, bilirubin 1.7.   Patient continued to have low grade fevers occasionally throughout the day so we stopped Levaquin 10/16 and started zosyn for better coverage.   Pt had 2 BM in past 12 hours. His bloating has improved. He also has slightly better PO intake.  Lipase level is within normal limits on 04/27/2014. Normal liver function enzymes.   No evidence of gallstone pancreatitis on abdominal ultrasound. Patient denied alcohol intake.  Active Problems:  Elevated LFTs   The patient may have passed a stone.   Acute hepatitis serologies negative.   LFTs on the admission showed AST 194, ALT 194, bilirubin 1.7. Liver enzymes now normalized.  Constipation   Pt had few BM's over past 24 hours after he has received mag citrate. Hyperglycemia   Hemoglobin A1c 5.4%. Leukocytosis / Aspiration PNA versus pneumonitis /Pleural effusion, left / Pancreatitis   Aspiration pneumonia versus pancreatic inflammation.  Changed Levaquin to  zosyn on 05/01/2014 since pt spiked low grade fever. He feels better this am.  Blood cultures not sent on admission. HIV negative. DVT Prophylaxis   Continue Lovenox.   Code Status: Full.  Family Communication: No family at the bedside.  Disposition Plan: Home when stable.    IV Access:   Peripheral IV Procedures and diagnostic studies:   [place] Abdomen Complete 04/24/2014: 1. The pancreas and left hepatic lobe were obscured by bowel gas. The observed portions of the liver or normal. The gallbladder exhibits no evidence of stones. 2. There is a simple appearing cyst in the midpole of the left kidney. 3. Further evaluation with abdominal and pelvic CT scanning may be useful in further evaluating the pancreas and liver.  Ct Abdomen Pelvis W Contrast 04/24/2014: 1. Findings are consistent with acute pancreatitis centered in the distal body and tail. There is peripancreatic fluid with fluid extending into the left paracolic gutter. There is a tiny amount of free fluid in the pelvis. There is no acute abnormality of the liver or gallbladder or spleen. 2. There is no acute urinary tract abnormality. 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 3. There is no acute bowel abnormality. There are small fat containing right inguinal and umbilical hernias. 4. There is bibasilar atelectasis or early pneumonia with small left pleural effusion. Electronically Signed By: David SwazilandJordan On: 04/24/2014 16:53  Dg Abd Acute W/chest 04/24/2014: Relative paucity of small bowel gas. This is a finding that may be seen normally but also may be seen with early enteritis or ileus. Obstruction is not felt to be likely. No lung edema or consolidation. There is slight left base atelectasis.  Medical Consultants:   None.   Other Consultants:   None.   Anti-Infectives:   Levaquin 04/24/14---> 05/01/14  Zosyn 05/01/14 -->   Manson PasseyEVINE, ALMA, MD  [facility] Pager [phone]  If 7PM-7AM, please contact  night-coverage [email or web address] Password [email or web address] 05/02/2014, 2:27 PM   LOS: 8 days    HPI/Subjective: No acute overnight events.  Objective: Filed Vitals:   05/01/14 1542 05/01/14 2119 05/02/14 0531 05/02/14 1419  BP: 149/81 133/76 133/82 134/79  Pulse: 78 83 66 81  Temp: 98.6 F (37 C) 97.6 F (36.4 C) 97.3 F (36.3 C) 98.2 F (36.8 C)  TempSrc: Oral Oral Oral Oral  Resp: 18 16 15 18   Height:      Weight:    109.8 kg (242 lb 1 oz)  SpO2: 97% 98% 99% 10%    Intake/Output Summary (Last 24 hours) at 05/02/14 1427 Last data filed at 05/02/14 0532  Gross per 24 hour  Intake   1120 ml  Output      0 ml  Net   1120 ml    Exam:   General:  Pt is  not in acute distress  Cardiovascular: Regular rate and rhythm, S1/S2, no murmurs  Respiratory: Clear to auscultation bilaterally, no wheezing, no crackles, no rhonchi  Abdomen: soft and distended, some tenderness in mid abdomen, bowel sounds present  Extremities: No edema, palpable bilaterally  Neuro: Grossly nonfocal  Data Reviewed: Basic Metabolic Panel:  Recent Labs Lab 04/26/14 0449 04/27/14 0510 05/01/14 0450  NA 140 138  --   K 4.3 4.6  --   CL 103 102  --   CO2 27 27  --   GLUCOSE 97 109*  --   BUN 19 15  --   CREATININE 1.28 1.05 0.93  CALCIUM 8.3* 8.5  --    Liver Function Tests:  Recent Labs Lab 04/26/14 0449 04/27/14 0510  AST 27 22  ALT 67* 44  ALKPHOS 69 69  BILITOT 1.1 1.1  PROT 6.2 6.3  ALBUMIN 3.0* 2.9*    Recent Labs Lab 04/26/14 0449 04/27/14 0510  LIPASE 288* 49   No results found for this basename: AMMONIA,  in the last 168 hours CBC:  Recent Labs Lab 04/28/14 0530 04/29/14 0635 04/30/14 1100 05/01/14 0450 05/02/14 0538  WBC 22.0* 19.9* 19.1* 21.5* 17.7*  HGB 14.2 13.9 14.0 13.4 13.3  HCT 40.7 40.1 41.0 38.4* 40.3  MCV 93.8 91.1 91.1 90.4 91.8  PLT 190 PLATELET CLUMPS NOTED ON SMEAR, COUNT APPEARS DECREASED 188 206 235   Cardiac Enzymes: No results found for this  basename: CKTOTAL, CKMB, CKMBINDEX, TROPONINI,  in the last 168 hours BNP: No components found with this basename: POCBNP,  CBG: No results found for this basename: GLUCAP,  in the last 168 hours  No results found for this or any previous visit (from the past 240 hour(s)).   Scheduled Meds: . enoxaparin (LOVENOX) injection  50 mg Subcutaneous Q24H  . pantoprazole (PROTONIX) IV  40 mg Intravenous Q24H  . piperacillin-tazobactam (ZOSYN)  IV  3.375 g Intravenous Q8H  . senna-docusate  1 tablet Oral QHS   Continuous Infusions: . sodium chloride 10 mL/hr at 05/01/14 0950

## 2014-05-03 LAB — CBC
HCT: 39.6 % (ref 39.0–52.0)
Hemoglobin: 13.5 g/dL (ref 13.0–17.0)
MCH: 31 pg (ref 26.0–34.0)
MCHC: 34.1 g/dL (ref 30.0–36.0)
MCV: 90.8 fL (ref 78.0–100.0)
PLATELETS: 215 10*3/uL (ref 150–400)
RBC: 4.36 MIL/uL (ref 4.22–5.81)
RDW: 13.6 % (ref 11.5–15.5)
WBC: 16 10*3/uL — ABNORMAL HIGH (ref 4.0–10.5)

## 2014-05-03 MED ORDER — MAGNESIUM CITRATE PO SOLN
1.0000 | Freq: Once | ORAL | Status: AC
  Administered 2014-05-03: 1 via ORAL
  Filled 2014-05-03: qty 296

## 2014-05-03 MED ORDER — PANTOPRAZOLE SODIUM 40 MG PO TBEC
40.0000 mg | DELAYED_RELEASE_TABLET | Freq: Every day | ORAL | Status: DC
  Administered 2014-05-04: 40 mg via ORAL
  Filled 2014-05-03: qty 1

## 2014-05-03 MED ORDER — ENSURE COMPLETE PO LIQD
237.0000 mL | Freq: Two times a day (BID) | ORAL | Status: DC
  Administered 2014-05-04: 237 mL via ORAL

## 2014-05-03 NOTE — Progress Notes (Signed)
Patient ID: Philip Pearson Debruyn, male   DOB: May 16, 1971, 43 y.o.   MRN: [id number] [facility] PROGRESS NOTE  Philip Pearson Noga [id number] DOB: May 16, 1971 DOA: 04/24/2014 PCP: No primary provider on file.  Brief narrative: 43 y.o. male with no significant PMH, nondrinker, nonsmoker, who was admitted 04/24/14 with acute pancreatitis of uncertain etiology. Lipase level was 3000 on admission. Abdominal ultrasound was negative for gallstones or ductal dilatation. Hospital course was complicated with development of possible aspiration pneumonia for which reason patient was started on Levaquin. He continued to spike low grade fever and has had persistent leukocytosis. We switched Levaquin to zosyn and noted improvement.  Assessment/Plan:   Principal Problem:  Acute pancreatitis  Findings consistent with acute pancreatitis as seen on CT abdomen on the admission. Lipase level more than 3000 on the admission. AST 194, ALT 194, ALP WNL, bilirubin 1.7.  Patient continued to have low grade fevers so we switched from Levaquin to zosyn for better coverage 05/01/2014. No further febrile episodes.   Lipase level is within normal limits on 04/27/2014. Normal liver function enzymes.  No evidence of gallstone pancreatitis on abdominal ultrasound. Patient denied alcohol intake.  Active Problems:  Elevated LFTs  The patient may have passed a stone.  Acute hepatitis serologies negative.  LFTs on the admission showed AST 194, ALT 194, bilirubin 1.7. Liver enzymes now normalized.  Constipation  Had BM after mag citrate. Hyperglycemia  Hemoglobin A1c 5.4%. Leukocytosis / Aspiration PNA versus pneumonitis /Pleural effusion, left / Pancreatitis  Aspiration pneumonia versus pancreatic inflammation.  Changed Levaquin to zosyn on 05/01/2014 since pt spiked low grade fever. Noted WBC count trending down.  Blood cultures not sent on admission. HIV negative. DVT Prophylaxis  Continue Lovenox.   Code Status:  Full.  Family Communication: No family at the bedside.  Disposition Plan: Home when stable likely in next 24 hours.   IV Access:   Peripheral IV  Procedures and diagnostic studies:   [place] Abdomen Complete 04/24/2014: 1. The pancreas and left hepatic lobe were obscured by bowel gas. The observed portions of the liver or normal. The gallbladder exhibits no evidence of stones. 2. There is a simple appearing cyst in the midpole of the left kidney. 3. Further evaluation with abdominal and pelvic CT scanning may be useful in further evaluating the pancreas and liver.  Ct Abdomen Pelvis W Contrast 04/24/2014: 1. Findings are consistent with acute pancreatitis centered in the distal body and tail. There is peripancreatic fluid with fluid extending into the left paracolic gutter. There is a tiny amount of free fluid in the pelvis. There is no acute abnormality of the liver or gallbladder or spleen. 2. There is no acute urinary tract abnormality. 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 3. There is no acute bowel abnormality. There are small fat containing right inguinal and umbilical hernias. 4. There is bibasilar atelectasis or early pneumonia with small left pleural effusion. Electronically Signed By: David Swaziland[place] On: 04/24/2014 16:53  Dg Abd Acute W/chest 04/24/2014: Relative paucity of small bowel gas. This is a finding that may be seen normally but also may be seen with early enteritis or ileus. Obstruction is not felt to be likely. No lung edema or consolidation. There is slight left base atelectasis.   Medical Consultants:   None.   Other Consultants:   None.   Anti-Infectives:   Levaquin 04/24/14---> 05/01/14  Zosyn 05/01/14 -->    Manson PasseyEVINE, Niya Behler, MD  [facility] Pager [phone]  If 7PM-7AM, please  contact night-coverage [email or web address] Password [email or web address] 05/03/2014, 12:25 PM   LOS: 9 days    HPI/Subjective: No acute overnight events.  Objective: Filed Vitals:   05/02/14  0531 05/02/14 1419 05/02/14 2101 05/03/14 0458  BP: 133/82 134/79 133/75 126/70  Pulse: 66 81 77 71  Temp: 97.3 F (36.3 C) 98.2 F (36.8 C) 99.5 F (37.5 C) 99 F (37.2 C)  TempSrc: Oral Oral Oral Oral  Resp: 15 18 16 16   Height:      Weight:  109.8 kg (242 lb 1 oz)    SpO2: 99% 10% 96% 93%    Intake/Output Summary (Last 24 hours) at 05/03/14 1225 Last data filed at 05/02/14 1916  Gross per 24 hour  Intake    240 ml  Output      0 ml  Net    240 ml    Exam:   General:  Pt is alert, follows commands appropriately, not in acute distress  Cardiovascular: Regular rate and rhythm, S1/S2, no murmurs  Respiratory: Clear to auscultation bilaterally, no wheezing, no crackles, no rhonchi  Abdomen: tender in mid abdomen to deep palpation, non distended, bowel sounds present  Extremities: No edema, pulses DP and PT palpable bilaterally  Neuro: Grossly nonfocal  Data Reviewed: Basic Metabolic Panel:  Recent Labs Lab 04/27/14 0510 05/01/14 0450  NA 138  --   K 4.6  --   CL 102  --   CO2 27  --   GLUCOSE 109*  --   BUN 15  --   CREATININE 1.05 0.93  CALCIUM 8.5  --    Liver Function Tests:  Recent Labs Lab 04/27/14 0510  AST 22  ALT 44  ALKPHOS 69  BILITOT 1.1  PROT 6.3  ALBUMIN 2.9*    Recent Labs Lab 04/27/14 0510  LIPASE 49   No results found for this basename: AMMONIA,  in the last 168 hours CBC:  Recent Labs Lab 04/29/14 0635 04/30/14 1100 05/01/14 0450 05/02/14 0538 05/03/14 0542  WBC 19.9* 19.1* 21.5* 17.7* 16.0*  HGB 13.9 14.0 13.4 13.3 13.5  HCT 40.1 41.0 38.4* 40.3 39.6  MCV 91.1 91.1 90.4 91.8 90.8  PLT PLATELET CLUMPS NOTED ON SMEAR, COUNT APPEARS DECREASED 188 206 235 215   Cardiac Enzymes: No results found for this basename: CKTOTAL, CKMB, CKMBINDEX, TROPONINI,  in the last 168 hours BNP: No components found with this basename: POCBNP,  CBG: No results found for this basename: GLUCAP,  in the last 168 hours  No results  found for this or any previous visit (from the past 240 hour(s)).   Scheduled Meds: . enoxaparin (LOVENOX) injection  50 mg Subcutaneous Q24H  . feeding supplement (ENSURE COMPLETE)  237 mL Oral BID BM  . pantoprazole (PROTONIX) IV  40 mg Intravenous Q24H  . piperacillin-tazobactam (ZOSYN)  IV  3.375 g Intravenous Q8H  . senna-docusate  1 tablet Oral QHS   Continuous Infusions: . sodium chloride 10 mL/hr at 05/01/14 0950

## 2014-05-03 NOTE — Progress Notes (Signed)
Pharmacy - Brief Note (IV to PO)  This patient is receiving pantoprazole. Based on criteria approved by the Pharmacy and Therapeutics Committee, this medication is being converted to the equivalent oral dose form. These criteria include:   . The patient is eating (either orally or per tube) and/or has been taking other orally administered medications for at least 24 hours.  . This patient has no evidence of active gastrointestinal bleeding or impaired GI absorption (gastrectomy, short bowel, patient on TNA or NPO).   If you have questions about this conversion, please contact the pharmacy department (ext [phone]).  Dannielle HuhZeigler, Edgard Debord George, [facility] StephenvilleRPH 05/03/2014 12:57 PM

## 2014-05-03 NOTE — Progress Notes (Signed)
INITIAL NUTRITION ASSESSMENT  DOCUMENTATION CODES Per approved criteria  -Obesity Unspecified -Not Applicable   INTERVENTION: Ensure Complete po BID, each supplement provides 350 kcal and 13 grams of protein  NUTRITION DIAGNOSIS: Inadequate oral intake related to pancreatitis as evidenced by poor po.   Goal: Pt to meet >/= 90% of their estimated nutrition needs   Monitor:  Weight trend, po intake, acceptance of supplements  Reason for Assessment: MST  43 y.o. male  Admitting Dx: Acute pancreatitis  ASSESSMENT: 43 y.o. male with no significant PMH, nondrinker, nonsmoker, who was admitted 04/24/14 with acute pancreatitis of uncertain etiology.   - Pt reported that his appetite is improving, but is not completely back to normal. He says he was able to tolerate some meats yesterday for the first time in several days. Unknown if pt has had any weight loss. No signs of fat or muscle wasting at this time.   Height: Ht Readings from Last 1 Encounters:  04/24/14 6' (1.829 m)    Weight: Wt Readings from Last 1 Encounters:  05/02/14 242 lb 1 oz (109.8 kg)    Ideal Body Weight: 77.6 kg  % Ideal Body Weight: 141%  Wt Readings from Last 10 Encounters:  05/02/14 242 lb 1 oz (109.8 kg)    Usual Body Weight: unknown  % Usual Body Weight: n/a  BMI:  Body mass index is 32.82 kg/(m^2).  Estimated Nutritional Needs: Kcal: 2000-2300 Protein: 120-130g= g Fluid: 2.0-2.3 L/day  Skin: Intact  Diet Order: General  EDUCATION NEEDS: -Education needs addressed   Intake/Output Summary (Last 24 hours) at 05/03/14 0949 Last data filed at 05/02/14 1916  Gross per 24 hour  Intake    240 ml  Output      0 ml  Net    240 ml    Last BM: 10/16   Labs:   Recent Labs Lab 04/27/14 0510 05/01/14 0450  NA 138  --   K 4.6  --   CL 102  --   CO2 27  --   BUN 15  --   CREATININE 1.05 0.93  CALCIUM 8.5  --   GLUCOSE 109*  --     CBG (last 3)  No results found for this  basename: GLUCAP,  in the last 72 hours  Scheduled Meds: . antiseptic oral rinse  7 mL Mouth Rinse q12n4p  . chlorhexidine  15 mL Mouth Rinse BID  . enoxaparin (LOVENOX) injection  50 mg Subcutaneous Q24H  . pantoprazole (PROTONIX) IV  40 mg Intravenous Q24H  . piperacillin-tazobactam (ZOSYN)  IV  3.375 g Intravenous Q8H  . senna-docusate  1 tablet Oral QHS    Continuous Infusions: . sodium chloride 10 mL/hr at 05/01/14 0950    Past Medical History  Diagnosis Date  . Medical history non-contributory     Past Surgical History  Procedure Laterality Date  . No past surgeries      Philip Pearson RD, LDN

## 2014-05-04 LAB — CBC
HEMATOCRIT: 40.6 % (ref 39.0–52.0)
HEMOGLOBIN: 13.6 g/dL (ref 13.0–17.0)
MCH: 30.8 pg (ref 26.0–34.0)
MCHC: 33.5 g/dL (ref 30.0–36.0)
MCV: 92.1 fL (ref 78.0–100.0)
Platelets: 264 10*3/uL (ref 150–400)
RBC: 4.41 MIL/uL (ref 4.22–5.81)
RDW: 13.7 % (ref 11.5–15.5)
WBC: 15.4 10*3/uL — AB (ref 4.0–10.5)

## 2014-05-04 LAB — BASIC METABOLIC PANEL
Anion gap: 12 (ref 5–15)
BUN: 12 mg/dL (ref 6–23)
CHLORIDE: 98 meq/L (ref 96–112)
CO2: 29 meq/L (ref 19–32)
Calcium: 8.5 mg/dL (ref 8.4–10.5)
Creatinine, Ser: 1.2 mg/dL (ref 0.50–1.35)
GFR calc Af Amer: 84 mL/min — ABNORMAL LOW (ref >90)
GFR calc non Af Amer: 72 mL/min — ABNORMAL LOW (ref >90)
GLUCOSE: 105 mg/dL — AB (ref 70–99)
POTASSIUM: 3.8 meq/L (ref 3.7–5.3)
SODIUM: 139 meq/L (ref 137–147)

## 2014-05-04 MED ORDER — TRAMADOL HCL 50 MG PO TABS: 50.0000 mg | ORAL_TABLET | Freq: Four times a day (QID) | ORAL | Status: DC | PRN

## 2014-05-04 MED ORDER — AMOXICILLIN-POT CLAVULANATE 875-125 MG PO TABS: 1.0000 | ORAL_TABLET | Freq: Two times a day (BID) | ORAL | Status: DC

## 2014-05-04 MED ORDER — IBUPROFEN 400 MG PO TABS
400.0000 mg | ORAL_TABLET | Freq: Once | ORAL | Status: AC
  Administered 2014-05-04: 400 mg via ORAL
  Filled 2014-05-04: qty 1

## 2014-05-04 MED ORDER — PROMETHAZINE HCL 25 MG/ML IJ SOLN: 12.5000 mg | Freq: Four times a day (QID) | INTRAMUSCULAR | Status: DC | PRN

## 2014-05-04 MED ORDER — IBUPROFEN 200 MG PO TABS: 400.0000 mg | ORAL_TABLET | Freq: Four times a day (QID) | ORAL | Status: DC | PRN

## 2014-05-04 NOTE — Plan of Care (Signed)
Problem: Discharge Progression Outcomes Goal: Discharge plan in place and appropriate Outcome: Progressing Home with wife Goal: Activity appropriate for discharge plan Outcome: Completed/Met Date Met:  05/04/14 Up ad lib in room and hallway

## 2014-05-04 NOTE — Discharge Instructions (Signed)

## 2014-05-04 NOTE — Discharge Summary (Signed)
Physician Discharge Summary  Philip Pearson ZOX:[id number] DOB: 24-Dec-1970 DOA: 04/24/2014  PCP: No primary provider on file.  Admit date: 04/24/2014 Discharge date: 05/04/2014  Recommendations for Outpatient Follow-up:  1. followup with primary care physician in 2-3 weeks after discharge. 2. continue Augmentin for 7 days on discharge.  Discharge Diagnoses:  Principal Problem:   Acute pancreatitis Active Problems:   Elevated LFTs   Hyperglycemia   Leukocytosis   Aspiration pneumonia versus pneumonitis   Pleural effusion, left    Discharge Condition: stable   Diet recommendation: as tolerated   History of present illness:  43 y.o. male with no significant PMH, nondrinker, nonsmoker, who was admitted 04/24/14 with acute pancreatitis of uncertain etiology. Lipase level was 3000 on admission. Abdominal ultrasound was negative for gallstones or ductal dilatation. Hospital course was complicated with development of possible aspiration pneumonia for which reason patient was started on Levaquin. He continued to spike low grade fever and has had persistent leukocytosis. We switched Levaquin to zosyn and noted improvement.   Assessment/Plan:   Principal Problem:  Acute pancreatitis  Findings consistent with acute pancreatitis as seen on CT abdomen on the admission. Lipase level more than 3000 on the admission. AST 194, ALT 194, ALP WNL, bilirubin 1.7.  Patient continued to have low grade fevers so we switched from Levaquin to zosyn for better coverage 05/01/2014. No further febrile episodes. Patient will take Augmentin for 7 days on discharge.  Lipase level is within normal limits on 04/27/2014. Normal liver function enzymes.  No evidence of gallstone pancreatitis on abdominal ultrasound. Patient denied alcohol intake.  Active Problems:  Elevated LFTs  The patient may have passed a stone.  Acute hepatitis serologies negative.  LFTs on the admission showed AST 194, ALT 194, bilirubin  1.7. Liver enzymes now normalized.  Constipation  Had BM after mag citrate. Hyperglycemia  Hemoglobin A1c 5.4%. Leukocytosis / Aspiration PNA versus pneumonitis /Pleural effusion, left / Pancreatitis  Aspiration pneumonia versus pancreatic inflammation.  Changed Levaquin to zosyn on 05/01/2014 since pt spiked low grade fever. White blood cell count trending down, 15.4 prior to discharge. Blood cultures not sent on admission. HIV negative. DVT Prophylaxis  Continue Lovenox while patient is in hospital.   Code Status: Full.  Family Communication: No family at the bedside.   IV Access:   Peripheral IV Procedures and diagnostic studies:   US Abdomen Complete 04/24/2014: 1. The pancreas and left hepatic lobe were obscured by bowel gas. The observed portions of the liver or normal. The gallbladder exhibits no evidence of stones. 2. There is a simple appearing cyst in the midpole of the left kidney. 3. Further evaluation with abdominal and pelvic CT scanning may be useful in further evaluating the pancreas and liver.  Ct Abdomen Pelvis W Contrast 04/24/2014: 1. Findings are consistent with acute pancreatitis centered in the distal body and tail. There is peripancreatic fluid with fluid extending into the left paracolic gutter. There is a tiny amount of free fluid in the pelvis. There is no acute abnormality of the liver or gallbladder or spleen. 2. There is no acute urinary tract abnormality. 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 3. There is no acute bowel abnormality. There are small fat containing right inguinal and umbilical hernias. 4. There is bibasilar atelectasis or early pneumonia with small left pleural effusion. Electronically Signed By: David Swaziland On: 04/24/2014 16:53  Dg Abd Acute W/chest 04/24/2014: Relative paucity of small bowel gas. This is a finding that may  be seen normally but also may be seen with early enteritis or ileus. Obstruction is not felt to be likely. No lung  edema or consolidation. There is slight left base atelectasis.   Medical Consultants:   None.   Other Consultants:   None.   Anti-Infectives:   Levaquin 04/24/14---> 05/01/14  Zosyn 05/01/14 --> 05/04/2014 Augmentin on discharge for 7 days.  SignedManson Passey:  Shahed Yeoman, MD  [facility] 05/04/2014, 10:23 AM  Pager #: [phone]   Discharge Exam: Filed Vitals:   05/04/14 0449  BP: 118/72  Pulse: 63  Temp: 98 F (36.7 C)  Resp: 16   Filed Vitals:   05/03/14 1403 05/03/14 2019 05/03/14 2109 05/04/14 0449  BP: 120/82 134/70  118/72  Pulse: 79 83  63  Temp: 97.4 F (36.3 C) 99.4 F (37.4 C) 98.3 F (36.8 C) 98 F (36.7 C)  TempSrc: Oral Axillary Axillary Oral  Resp: 18 16  16   Height:      Weight:      SpO2: 98% 98%  99%    General: Pt is alert, follows commands appropriately, not in acute distress Cardiovascular: Regular rate and rhythm, S1/S2 +, no murmurs Respiratory: Clear to auscultation bilaterally, no wheezing, no crackles, no rhonchi Abdominal: Soft, non tender, non distended, bowel sounds +, no guarding Extremities: no edema, no cyanosis, pulses palpable bilaterally DP and PT Neuro: Grossly nonfocal  Discharge Instructions  Discharge Instructions   Call MD for:  difficulty breathing, headache or visual disturbances    Complete by:  As directed      Call MD for:  persistant dizziness or light-headedness    Complete by:  As directed      Call MD for:  persistant nausea and vomiting    Complete by:  As directed      Call MD for:  severe uncontrolled pain    Complete by:  As directed      Diet - low sodium heart healthy    Complete by:  As directed      Discharge instructions    Complete by:  As directed   1. followup with primary care physician in 2-3 weeks after discharge. 2. continue Augmentin for 7 days on discharge.     Increase activity slowly    Complete by:  As directed             Medication List         amoxicillin-clavulanate  875-125 MG per tablet  Commonly known as:  AUGMENTIN  Take 1 tablet by mouth 2 (two) times daily.     magnesium oxide 400 MG tablet  Commonly known as:  MAG-OX  Take 12,000 mg by mouth daily as needed (for constipation).     omeprazole 40 MG capsule  Commonly known as:  PRILOSEC  Take 40 mg by mouth daily as needed (for acid reflex).     ranitidine 150 MG tablet  Commonly known as:  ZANTAC  Take 150 mg by mouth daily as needed for heartburn.     traMADol 50 MG tablet  Commonly known as:  ULTRAM  Take 1 tablet (50 mg total) by mouth every 6 (six) hours as needed for moderate pain.          The results of significant diagnostics from this hospitalization (including imaging, microbiology, ancillary and laboratory) are listed below for reference.    Significant Diagnostic Studies: [place] Abdomen Complete  04/24/2014   CLINICAL DATA:  Abdominal pain ; pancreatitis and transaminitis  EXAM: ULTRASOUND ABDOMEN COMPLETE ; the study was limited due to the patient's body habitus and bowel gas.  COMPARISON:  None.  FINDINGS: Gallbladder: No gallstones or wall thickening visualized. No sonographic Murphy sign noted.  Common bile duct: Diameter: 2.5 mm  Liver: No focal lesion identified, but the left lobe was obscured by bowel gas. The hepatic echotexture is increased.  IVC: No abnormality visualized.  Pancreas: Obscured by bowel gas.  Spleen: Size and appearance within normal limits.  Right Kidney: Length: 11.6 cm. Echogenicity within normal limits. No mass or hydronephrosis visualized.  Left Kidney: Length: 11.2 cm. Echogenicity within normal limits. There is a simple appearing midpole cyst measuring 4.2 cm in greatest dimension. No hydronephrosis visualized.  Abdominal aorta: Obscured by bowel gas.  Other findings: None.  IMPRESSION: 1. The pancreas and left hepatic lobe were obscured by bowel gas. The observed portions of the liver or normal. The gallbladder exhibits no evidence of stones. 2. A46American Standard CompaFirst Hospital [facility] V75Hessie Knowsimple appearingA13American Standard CompaEastern [facility] Medical C29Hessie Knowsican Standard CompaAdvant67m38eaPACCAR 57[facility]1 TGeorgiWoodland Memorial HospGJTexaShaune Pollackan7[address] Airport LaneMemorial Ho48s110Belenda Cruis trasound of today's date.  FINDINGS: There is abnormal appearance of the distal pancreatic body and pancreatic tail. The parenchyma is edematous and there is surrounding free fluid with fluid extending around the spleen and inferiorly in the left paracolic gutter. There are milder inflammatory changes of the pancreatic head and proximal and mid body. There is no pancreatic mass. The pancreatic duct is not dilated.  The liver, gallbladder, common bile duct, spleen, adrenal glands, and kidneys exhibit no acute abnormalities. There is a dominant mid pole exophytic hypodensity with HU value of -3 measuring 4.6 cm in greatest dimension. There is no hydronephrosis.  The stomach is moderately distended with contrast and a small amount of food. No gastric wall thickening is demonstrated. The small and large bowel exhibit no evidence of obstruction or significant ileus. The urinary bladder, prostate gland, and seminal vesicles appear normal for age. There is a small amount of free pelvic fluid There is a fat containing right inguinal hernia and a small inguinal hernia containing fat.  There is bilateral lower lobe atelectasis or early pneumonia. A  trace of pleural fluid in the posterior costophrenic gutter on the left is present. The lumbar spine and bony pelvis are unremarkable.  IMPRESSION: 1. Findings are consistent with acute pancreatitis centered in the distal body and tail. There is peripancreatic fluid with fluid extending into the left paracolic gutter. There is a tiny amount of free fluid in the pelvis. There is no acute abnormality of the liver or gallbladder or spleen. 2. There is no acute urinary tract abnormality. 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 3. There is no acute bowel abnormality. There are small fat containing right inguinal and umbilical hernias. 4. There is bibasilar atelectasis or early  pneumonia with small left pleural effusion.   Electronically Signed   By: David  Swaziland   On: 04/24/2014 16:53   Dg Abd Acute W/chest  04/24/2014   CLINICAL DATA:  Nausea and vomiting for past 9 hr; lower abdominal pain  EXAM: ACUTE ABDOMEN SERIES (ABDOMEN 2 VIEW & CHEST 1 VIEW)  COMPARISON:  None.  FINDINGS: PA chest: There is slight atelectasis in the left base. Elsewhere lungs are clear. Heart size and pulmonary vascularity are normal. No adenopathy.  Supine and upright abdomen: There is a relative paucity of small bowel gas. There is moderate stool in the colon. There is no bowel dilatation or air-fluid level as is typically seen with obstruction. No free air. There is a probable phlebolith in the pelvis.  IMPRESSION: Relative paucity of small bowel gas. This is a finding that may be seen normally but also may be seen with early enteritis or ileus. Obstruction is not felt to be likely. No lung edema or consolidation. There is slight left base atelectasis.   Electronically Signed   By: Bretta Bang M.D.   On: 04/24/2014 11:45    Microbiology: No results found for this or any previous visit (from the past 240 hour(s)).   Labs: Basic Metabolic Panel:  Recent Labs Lab 05/01/14 0450 05/04/14 0416  NA  --  139  K   --  3.8  CL  --  98  CO2  --  29  GLUCOSE  --  105*  BUN  --  12  CREATININE 0.93 1.20  CALCIUM  --  8.5   Liver Function Tests: No results found for this basename: AST, ALT, ALKPHOS, BILITOT, PROT, ALBUMIN,  in the last 168 hours No results found for this basename: LIPASE, AMYLASE,  in the last 168 hours No results found for this basename: AMMONIA,  in the last 168 hours CBC:  Recent Labs Lab 04/30/14 1100 05/01/14 0450 05/02/14 0538 05/03/14 0542 05/04/14 0416  WBC 19.1* 21.5* 17.7* 16.0* 15.4*  HGB 14.0 13.4 13.3 13.5 13.6  HCT 41.0 38.4* 40.3 39.6 40.6  MCV 91.1 90.4 91.8 90.8 92.1  PLT 188 206 235 215 264   Cardiac Enzymes: No results found for this basename: CKTOTAL, CKMB, CKMBINDEX, TROPONINI,  in the last 168 hours BNP: BNP (last 3 results) No results found for this basename: PROBNP,  in the last 8760 hours CBG: No results found for this basename: GLUCAP,  in the last 168 hours  Time coordinating discharge: Over 30 minutes

## 2014-05-04 NOTE — Progress Notes (Signed)
ANTIBIOTIC CONSULT NOTE - Follow up  Pharmacy Consult for Zosyn Indication: CAP vs Aspiration PNA vs Pancreatitis  Allergies  Allergen Reactions  . Codeine Nausea And Vomiting   Patient Measurements: Height: 6' (182.9 cm) Weight: 242 lb 1 oz (109.8 kg) IBW/kg (Calculated) : 77.6  Assessment: 43 yr male admitted 10/9 with complaint of abdominal pain. Of note, recently diagnosed with bronchitis and given antibiotics. Patient being admitted for acute pancreatitis. Chest Xray shows early pneumonia and Pharmacy was consulted to dose Levofloxacin 10/9.  10/9 CT abd shows bibasilar atelectasis or early pna with small L pleural effusion.  Antiinfectives 10/9 >> levofloxacin >> 10/16 10/16 >> Zosyn >>  Labs / vitals Tmax: 98 WBCs: increased at 15.4 but improving Renal: SCr 1.2, wnl  No micro data.  UA from 10/9 negative for UTI Hepatitis panel negative HIV antibody nonreactive  10/19: D11 antibiotics, 7 days Levaquin for susptected CAP, but also considering aspiration pneumonia given recent h/o N/V. Switched to Zosyn on 10/16 with continued temps, leukocytosis. Pt now afebrile. WBC elevated but trending down 15.4 today (previously 17.7 on 10/17). Renal function stable.   Goal of Therapy:  Eradication of infection  Plan:   Continue Zosyn 3.375gm q8hr-4 hr infusion   Thank you for the consult,  Christell Faithhris Nadine Ryle,  PharmD, BCOP Pharmacy: [phone]  05/04/2014, 8:42 AM

## 2014-05-04 NOTE — Progress Notes (Signed)
Pt discharged home in stable condition. Discharge instructions and scripts given. Pt verbalized understanding 

## 2014-05-28 ENCOUNTER — Ambulatory Visit (INDEPENDENT_AMBULATORY_CARE_PROVIDER_SITE_OTHER): Payer: PRIVATE HEALTH INSURANCE | Admitting: Medical

## 2014-05-28 ENCOUNTER — Encounter: Payer: Self-pay | Admitting: Medical

## 2014-05-28 VITALS — BP 123/78 | HR 104 | Temp 98.0°F | Ht 71.5 in | Wt 224.0 lb

## 2014-05-28 DIAGNOSIS — K858 Other acute pancreatitis without necrosis or infection: Secondary | ICD-10-CM

## 2014-05-28 DIAGNOSIS — R1013 Epigastric pain: Secondary | ICD-10-CM | POA: Insufficient documentation

## 2014-05-28 DIAGNOSIS — G4700 Insomnia, unspecified: Secondary | ICD-10-CM | POA: Insufficient documentation

## 2014-05-28 LAB — CBC WITH DIFFERENTIAL/PLATELET
BASOS PCT: 0.7 % (ref 0.0–3.0)
Basophils Absolute: 0 10*3/uL (ref 0.0–0.1)
Eosinophils Absolute: 0.2 10*3/uL (ref 0.0–0.7)
Eosinophils Relative: 3.7 % (ref 0.0–5.0)
HCT: 47 % (ref 39.0–52.0)
HEMOGLOBIN: 15.6 g/dL (ref 13.0–17.0)
Lymphocytes Relative: 25.8 % (ref 12.0–46.0)
Lymphs Abs: 1.6 10*3/uL (ref 0.7–4.0)
MCHC: 33.1 g/dL (ref 30.0–36.0)
MCV: 91.9 fl (ref 78.0–100.0)
MONO ABS: 0.6 10*3/uL (ref 0.1–1.0)
Monocytes Relative: 10 % (ref 3.0–12.0)
NEUTROS ABS: 3.8 10*3/uL (ref 1.4–7.7)
Neutrophils Relative %: 59.8 % (ref 43.0–77.0)
Platelets: 215 10*3/uL (ref 150.0–400.0)
RBC: 5.12 Mil/uL (ref 4.22–5.81)
RDW: 13.9 % (ref 11.5–15.5)
WBC: 6.4 10*3/uL (ref 4.0–10.5)

## 2014-05-28 LAB — COMPREHENSIVE METABOLIC PANEL
ALBUMIN: 3.4 g/dL — AB (ref 3.5–5.2)
ALK PHOS: 70 U/L (ref 39–117)
ALT: 13 U/L (ref 0–53)
AST: 16 U/L (ref 0–37)
BUN: 12 mg/dL (ref 6–23)
CALCIUM: 9.6 mg/dL (ref 8.4–10.5)
CHLORIDE: 109 meq/L (ref 96–112)
CO2: 18 mEq/L — ABNORMAL LOW (ref 19–32)
Creatinine, Ser: 1.1 mg/dL (ref 0.4–1.5)
GFR: 80.3 mL/min (ref >60.00)
GLUCOSE: 95 mg/dL (ref 70–99)
Potassium: 3.8 mEq/L (ref 3.5–5.1)
Sodium: 140 mEq/L (ref 135–145)
Total Bilirubin: 0.5 mg/dL (ref 0.2–1.2)
Total Protein: 7 g/dL (ref 6.0–8.3)

## 2014-05-28 LAB — LIPASE: LIPASE: 31 U/L (ref 11.0–59.0)

## 2014-05-28 MED ORDER — TRAZODONE HCL 50 MG PO TABS: 25.0000 mg | ORAL_TABLET | Freq: Every evening | ORAL | Status: DC | PRN

## 2014-05-28 NOTE — Progress Notes (Signed)
Pre visit review using our clinic review tool, if applicable. No additional management support is needed unless otherwise documented below in the visit note. 

## 2014-05-28 NOTE — Progress Notes (Signed)
Subjective:    Patient ID: Philip ReadCharles Pearson, male    DOB: 07-31-1970, 43 y.o.   MRN: [id number]  HPI   Pt here for first time. PMH, PSH, FH, Soc hx, and surgical hx reviewed.  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  No caffeine, Has fiancee. No children.  Pt tells me h pylori was negative before dx in oct pancreatitis. .  Pt give review of Pancreatitis 04-2014. He vomited around then and ended up with aspiration pneumonia. Pt was hospitalized for 10 days Pt does not drink alchohol and no gallbladder disease. Pt now feels a lot better.   Pt has low level 1/10 epigastric pain. Daily pain. If he eats to much his pain will be worsened. Greasy and fatty foods make it worse.  No diarrhea,no vomiting, no black or bloody stools.  Pt used to weight 300 but gradual purposeful weight loss over 2 yrs.  Past Medical History  Diagnosis Date  . Medical history non-contributory   . Pancreatitis     No alcohol use and no gallbladder disease.  Marland Kitchen. GERD (gastroesophageal reflux disease)     given dx before pancreatitis.    History   Social History  . Marital Status: Married    Spouse Name: N/A    Number of Children: N/A  . Years of Education: N/A   Occupational History  . Not on file.   Social History Main Topics  . Smoking status: Never Smoker   . Smokeless tobacco: Never Used  . Alcohol Use: No  . Drug Use: No  . Sexual Activity: Not on file   Other Topics Concern  . Not on file   Social History Narrative    Past Surgical History  Procedure Laterality Date  . No past surgeries      Family History  Problem Relation Age of Onset  . COPD Father     Allergies  Allergen Reactions  . Codeine Nausea And Vomiting    Current Outpatient Prescriptions on File Prior to Visit  Medication Sig Dispense Refill  . omeprazole (PRILOSEC) 40 MG capsule Take 40 mg by mouth daily as needed (for acid reflex).      . ranitidine (ZANTAC) 150 MG tablet Take 150 mg by mouth daily as needed for heartburn.    Marland Kitchen. amoxicillin-clavulanate (AUGMENTIN) 875-125 MG per tablet Take 1 tablet by mouth 2 (two) times daily. 14 tablet 0  . magnesium oxide (MAG-OX) 400 MG tablet Take 12,000 mg by mouth daily as needed (for constipation).    . traMADol (ULTRAM) 50 MG tablet Take 1 tablet (50 mg total) by mouth every 6 (six) hours as needed for moderate pain. 30 tablet 0   No current facility-administered medications on file prior to visit.    BP 123/78 mmHg  Pulse 104  Temp(Src) 98 F (36.7 C) (Oral)  Ht 5' 11.5" (1.816 m)  Wt 224 lb (101.606 kg)  BMI 30.81 kg/m2  SpO2 96%      Review of Systems  Constitutional: Negative for fever, chills and fatigue.  HENT: Negative.   Respiratory: Negative for cough, chest tightness, shortness of breath and wheezing.   Cardiovascular: Negative for chest pain and palpitations.  Gastrointestinal: Positive for abdominal pain. Negative for nausea, vomiting, diarrhea, constipation, blood in stool, abdominal distention, anal bleeding and rectal pain.       As described on hpi.  Genitourinary: Negative.   Musculoskeletal: Negative.   Neurological: Negative.  Hematological: Negative for adenopathy. Does not bruise/bleed easily.  Psychiatric/Behavioral: Negative.        Objective:   Physical Exam  General Appearance- Not in acute distress.  HEENT Eyes- Scleraeral/Conjuntiva-bilat- Not Yellow. Mouth & Throat- Normal.  Chest and Lung Exam Auscultation: Breath sounds:-Normal. Adventitious sounds:- No Adventitious sounds.  Cardiovascular Auscultation:Rythm - Regular. Heart Sounds -Normal heart sounds.  Abdomen Inspection:-Inspection Normal.  Palpation/Perucssion: Palpation and Percussion of the abdomen reveal- Tender on faint in lower epigastric area, No Rebound tenderness, No rigidity(Guarding) and No Palpable abdominal masses. No rlq pain. No ruq  pain. Liver:-Normal.  Spleen:- Normal.   Back- No cva tenderness.  .       Assessment & Plan:

## 2014-05-28 NOTE — Assessment & Plan Note (Signed)
For your abdominal pain, I want you to start zantac and eat very healthy. I want to do some labs today. If your abdomen pain worsens  then ED eval. Follow up in 2 wks or as needed.  May refer you to GI in the future.  Pt wants at least 2 more weeks off work. He has been off since hospitalazation.

## 2014-05-28 NOTE — Assessment & Plan Note (Signed)
Get labs and lipase today. Follow closely. May refer to Gi in near future as follow up from hospitalization.

## 2014-05-28 NOTE — Assessment & Plan Note (Signed)
trazadone 25-50 mg q hs.

## 2014-05-28 NOTE — Patient Instructions (Addendum)
For your abdominal pain, I want you to start zantac and eat very healthy. I want to do some labs today. If your abdomen pain worsens  then ED eval. Follow up in 2 wks or as needed.  May refer you to GI in the future.  Pt wants at least 2 more weeks off work. He has been off since hospitalazation.  Also you mention some insomnia. Will rx trazodone.

## 2014-06-09 ENCOUNTER — Encounter: Payer: Self-pay | Admitting: Gastroenterology

## 2014-06-09 ENCOUNTER — Ambulatory Visit (INDEPENDENT_AMBULATORY_CARE_PROVIDER_SITE_OTHER): Payer: PRIVATE HEALTH INSURANCE | Admitting: Medical

## 2014-06-09 ENCOUNTER — Encounter: Payer: Self-pay | Admitting: Medical

## 2014-06-09 ENCOUNTER — Telehealth: Payer: Self-pay | Admitting: Medical

## 2014-06-09 VITALS — BP 135/87 | HR 84 | Temp 97.8°F | Ht 72.2 in | Wt 231.8 lb

## 2014-06-09 DIAGNOSIS — R1013 Epigastric pain: Secondary | ICD-10-CM

## 2014-06-09 DIAGNOSIS — K858 Other acute pancreatitis without necrosis or infection: Secondary | ICD-10-CM

## 2014-06-09 DIAGNOSIS — G4700 Insomnia, unspecified: Secondary | ICD-10-CM

## 2014-06-09 MED ORDER — TRAZODONE HCL 50 MG PO TABS: 25.0000 mg | ORAL_TABLET | Freq: Every evening | ORAL | Status: DC | PRN

## 2014-06-09 NOTE — Telephone Encounter (Signed)
Caller name:Staat, Anfernee Relation to BJ:YNWGpt:self Call back number:[phone] Pharmacy:  Reason for call:  Pt was seen today, states his job is requesting documentation that pt can return to work on Monday. Pt would like to know if it can be emailed to him at [email or web address] if not possible please call him and he will provide the fax number to his job.

## 2014-06-09 NOTE — Assessment & Plan Note (Signed)
For your insomnia, I want you to continue trazadone but then try to taper off as you resume your regular work schedule

## 2014-06-09 NOTE — Progress Notes (Signed)
Pre visit review using our clinic review tool, if applicable. No additional management support is needed unless otherwise documented below in the visit note. 

## 2014-06-09 NOTE — Assessment & Plan Note (Signed)
He reports no pain with the zantac. Doing well. He will continue otc.

## 2014-06-09 NOTE — Progress Notes (Signed)
Subjective:    Patient ID: Philip ReadCharles Tayag, male    DOB: Aug 18, 1970, 43 y.o.   MRN: [id number]  HPI  Pt states that his stomach feels good. He is taking zantac. He reports no nausea and no vomiting. He is eating very healthy. He was originally here to see me last time for follow up on his pancreatitis. Lipase was normal.  Pt states his insomnia is improved. Only taking half tablet of trazadone.  He is in today to make sure/assess if ready to work. Pt thinks he feels well enough to work.  Past Medical History  Diagnosis Date  . Medical history non-contributory   . Pancreatitis     No alcohol use and no gallbladder disease.  Marland Kitchen. GERD (gastroesophageal reflux disease)     given dx before pancreatitis.    History   Social History  . Marital Status: Married    Spouse Name: N/A    Number of Children: N/A  . Years of Education: N/A   Occupational History  . Not on file.   Social History Main Topics  . Smoking status: Never Smoker   . Smokeless tobacco: Never Used  . Alcohol Use: No  . Drug Use: No  . Sexual Activity: Yes   Other Topics Concern  . Not on file   Social History Narrative    Past Surgical History  Procedure Laterality Date  . No past surgeries      Family History  Problem Relation Age of Onset  . COPD Father     Allergies  Allergen Reactions  . Codeine Nausea And Vomiting    Current Outpatient Prescriptions on File Prior to Visit  Medication Sig Dispense Refill  . magnesium oxide (MAG-OX) 400 MG tablet Take 12,000 mg by mouth daily as needed (for constipation).    . ranitidine (ZANTAC) 150 MG tablet Take 150 mg by mouth daily as needed for heartburn.    . traZODone (DESYREL) 50 MG tablet Take 0.5-1 tablets (25-50 mg total) by mouth at bedtime as needed for sleep. 15 tablet 0   No current facility-administered medications on file prior to visit.    BP 135/87 mmHg  Pulse 84  Temp(Src) 97.8 F (36.6 C) (Oral)  Ht 6' 0.2" (1.834 m)  Wt  231 lb 12.8 oz (105.144 kg)  BMI 31.26 kg/m2  SpO2 98%    Review of Systems  Constitutional: Negative for fever, chills and fatigue.  HENT: Negative for congestion, ear discharge, ear pain, nosebleeds, postnasal drip, rhinorrhea, sinus pressure, sneezing, sore throat and trouble swallowing.   Respiratory: Negative for cough, chest tightness, shortness of breath and wheezing.   Cardiovascular: Negative for chest pain and palpitations.  Gastrointestinal: Negative for nausea, abdominal pain, diarrhea and rectal pain.       Stomach feels better.  Musculoskeletal: Negative for back pain.  Neurological: Negative for dizziness, tremors, seizures, syncope, facial asymmetry, weakness, light-headedness, numbness and headaches.  Hematological: Negative for adenopathy. Does not bruise/bleed easily.  Psychiatric/Behavioral: Positive for sleep disturbance. Negative for suicidal ideas, behavioral problems, self-injury and dysphoric mood. The patient is not nervous/anxious.        Sleep improved.       Objective:   Physical Exam  General Appearance- Not in acute distress.  HEENT Eyes- Scleraeral/Conjuntiva-bilat- Not Yellow. Mouth & Throat- Normal.  Chest and Lung Exam Auscultation: Breath sounds:-Normal. Adventitious sounds:- No Adventitious sounds.  Cardiovascular Auscultation:Rythm - Regular. Heart Sounds -Normal heart sounds.  Abdomen Inspection:-Inspection Normal.  Palpation/Perucssion: Palpation and Percussion  of the abdomen reveal- Non Tender, No Rebound tenderness, No rigidity(Guarding) and No Palpable abdominal masses.  Liver:-Normal.  Spleen:- Normal.          Assessment & Plan:

## 2014-06-09 NOTE — Telephone Encounter (Signed)
Note made and signed. Place on lpn desk. Sending her message.

## 2014-06-09 NOTE — Patient Instructions (Signed)
For your pancreatitis history, I do want you to continue eating very healthy diet and continue zantac. I did decide to refer you to GI.  For your insomnia, I want you to continue trazadone but then try to taper off as you resume your regular work schedule.  Return to work. If problems on return to work let us know.  Follow up in 3 months for wellness exam. Come in fasting.

## 2014-06-09 NOTE — Assessment & Plan Note (Addendum)
For your pancreatitis history, I do want you to continue eating very healthy diet and continue zantac. I did decide to refer you to GI. Since pt is stable I will not make this urgent. Rather just want him to have GI established since no cause was present. And if were to reoccur randomly think it would be best for him to have a GI MD.

## 2014-06-29 ENCOUNTER — Ambulatory Visit: Payer: PRIVATE HEALTH INSURANCE | Admitting: Family

## 2014-07-27 ENCOUNTER — Encounter: Payer: Self-pay | Admitting: Gastroenterology

## 2014-07-29 ENCOUNTER — Ambulatory Visit: Payer: PRIVATE HEALTH INSURANCE | Admitting: Gastroenterology

## 2014-09-09 ENCOUNTER — Encounter: Payer: Self-pay | Admitting: Gastroenterology

## 2014-09-09 ENCOUNTER — Ambulatory Visit (INDEPENDENT_AMBULATORY_CARE_PROVIDER_SITE_OTHER): Payer: PRIVATE HEALTH INSURANCE | Admitting: Gastroenterology

## 2014-09-09 VITALS — BP 120/74 | HR 76 | Ht 71.75 in | Wt 246.5 lb

## 2014-09-09 DIAGNOSIS — R109 Unspecified abdominal pain: Secondary | ICD-10-CM

## 2014-09-09 DIAGNOSIS — K859 Acute pancreatitis, unspecified: Secondary | ICD-10-CM

## 2014-09-09 NOTE — Patient Instructions (Addendum)
MRI abdomen with MRCP images as well to check for CBD stones, gallstones, pancreas mass. [facility]. Your appointment time is 7 am. Please arrive 15 minutes prior to your appointment time for registration purposes. Please make certain not to have anything to eat or drink 6 hours prior to your test. In addition, if you have any metal in your body, have a pacemaker or defibrillator, please be sure to let your ordering physician know. This test typically takes 45 minutes to 1 hour to complete.

## 2014-09-09 NOTE — Progress Notes (Signed)
HPI: This is a very pleasant 44 year old man whom I am meeting for the first time today.    Was hospitalized [facility] for 10 days for acute pancreatitis.  No gallstones on US, non drinker.  He had been having intermittent pains prior to that, epigastric. Pain would last 15-20 min, then nausea.  IMPRESSION CT 04/2014: 1. Findings are consistent with acute pancreatitis centered in the distal body and tail. There is peripancreatic fluid with fluid extending into the left paracolic gutter. There is a tiny amount of free fluid in the pelvis. There is no acute abnormality of the liver or gallbladder or spleen. 2. There is no acute urinary tract abnormality. 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 3. There is no acute bowel abnormality. There are small fat containing right inguinal and umbilical hernias. 4. There is bibasilar atelectasis or early pneumonia with small left pleural effusion.  US 04/2014: normal GB without stones, no ductal dilation  Labs 04/2014: lipase >3000, lfts at admit were significantly elevated (transaminases 1-200, tbili 1.7), all returned to normal quickly.   He is is eating normally. Weight overall stable.   Review of systems: Pertinent positive and negative review of systems were noted in the above HPI section. Complete review of systems was performed and was otherwise normal.    Past Medical History  Diagnosis Date  . Medical history non-contributory   . Pancreatitis     No alcohol use and no gallbladder disease.  Marland Kitchen. GERD (gastroesophageal reflux disease)     given dx before pancreatitis.    Past Surgical History  Procedure Laterality Date  . No past surgeries      Current Outpatient Prescriptions  Medication Sig Dispense Refill  . magnesium oxide (MAG-OX) 400 MG tablet Take 12,000 mg by mouth daily as needed (for constipation).     No current facility-administered medications for this visit.    Allergies as of 09/09/2014 - Review  Complete 09/09/2014  Allergen Reaction Noted  . Codeine Nausea And Vomiting 04/24/2014    Family History  Problem Relation Age of Onset  . Emphysema Father   . Colon cancer Neg Hx   . Colon polyps Neg Hx   . Kidney disease Father   . Diabetes Neg Hx   . Esophageal cancer Neg Hx   . Heart disease Neg Hx   . Gallbladder disease Neg Hx     History   Social History  . Marital Status: Married    Spouse Name: N/A  . Number of Children: 0  . Years of Education: N/A   Occupational History  . Delivery Driver    Social History Main Topics  . Smoking status: Never Smoker   . Smokeless tobacco: Never Used  . Alcohol Use: No  . Drug Use: No  . Sexual Activity: Yes   Other Topics Concern  . Not on file   Social History Narrative       Physical Exam: BP 120/74 mmHg  Pulse 76  Ht 5' 11.75" (1.822 m)  Wt 246 lb 8 oz (111.812 kg)  BMI 33.68 kg/m2 Constitutional: generally well-appearing Psychiatric: alert and oriented x3 Eyes: extraocular movements intact Mouth: oral pharynx moist, no lesions Neck: supple no lymphadenopathy Cardiovascular: heart regular rate and rhythm Lungs: clear to auscultation bilaterally Abdomen: soft, nontender, nondistended, no obvious ascites, no peritoneal signs, normal bowel sounds Extremities: no lower extremity edema bilaterally Skin: no lesions on visible extremities    Assessment and plan: 44 y.o. male with  resolved mild acute pancreatitis 4 months ago.  Unclear etiology however I strongly favor this was biliary related given his transient significant elevation of liver tests, his precursor intermittent epigastric pains it really seemed biliary as well. He ultrasound showed no gallstones however I wonder thousand false negative. Going to repeat imaging now with MRI, MRCP. This will give good imaging of his gallbladder, bile ducts, also his pancreas.

## 2014-09-14 ENCOUNTER — Ambulatory Visit (INDEPENDENT_AMBULATORY_CARE_PROVIDER_SITE_OTHER): Payer: Managed Care, Other (non HMO) | Admitting: Medical

## 2014-09-14 VITALS — BP 137/83 | HR 70 | Temp 98.3°F | Ht 71.75 in | Wt 250.0 lb

## 2014-09-14 DIAGNOSIS — Z0189 Encounter for other specified special examinations: Secondary | ICD-10-CM

## 2014-09-14 DIAGNOSIS — Z0000 Encounter for general adult medical examination without abnormal findings: Secondary | ICD-10-CM | POA: Insufficient documentation

## 2014-09-14 DIAGNOSIS — Z23 Encounter for immunization: Secondary | ICD-10-CM

## 2014-09-14 LAB — POCT URINALYSIS DIPSTICK
BILIRUBIN UA: NEGATIVE
Glucose, UA: NEGATIVE
Ketones, UA: NEGATIVE
LEUKOCYTES UA: NEGATIVE
NITRITE UA: NEGATIVE
PROTEIN UA: NEGATIVE
RBC UA: NEGATIVE
Spec Grav, UA: <=1.005
UROBILINOGEN UA: 0.2
pH, UA: 7.5

## 2014-09-14 NOTE — Progress Notes (Signed)
Pre visit review using our clinic review tool, if applicable. No additional management support is needed unless otherwise documented below in the visit note. 

## 2014-09-14 NOTE — Assessment & Plan Note (Addendum)
Will get cbc, cmp, tsh, lipid panel. tdap and flu vaccine today.

## 2014-09-14 NOTE — Patient Instructions (Signed)
Cbc, cmp, tsh, lipid panel. Tdap, and flu vaccine.  Keep exercising and healthy diet.  Preventive Care for Adults A healthy lifestyle and preventive care can promote health and wellness. Preventive health guidelines for men include the following key practices:  A routine yearly physical is a good way to check with your health care provider about your health and preventative screening. It is a chance to share any concerns and updates on your health and to receive a thorough exam.  Visit your dentist for a routine exam and preventative care every 6 months. Brush your teeth twice a day and floss once a day. Good oral hygiene prevents tooth decay and gum disease.  The frequency of eye exams is based on your age, health, family medical history, use of contact lenses, and other factors. Follow your health care provider's recommendations for frequency of eye exams.  Eat a healthy diet. Foods such as vegetables, fruits, whole grains, low-fat dairy products, and lean protein foods contain the nutrients you need without too many calories. Decrease your intake of foods high in solid fats, added sugars, and salt. Eat the right amount of calories for you.Get information about a proper diet from your health care provider, if necessary.  Regular physical exercise is one of the most important things you can do for your health. Most adults should get at least 150 minutes of moderate-intensity exercise (any activity that increases your heart rate and causes you to sweat) each week. In addition, most adults need muscle-strengthening exercises on 2 or more days a week.  Maintain a healthy weight. The body mass index (BMI) is a screening tool to identify possible weight problems. It provides an estimate of body fat based on height and weight. Your health care provider can find your BMI and can help you achieve or maintain a healthy weight.For adults 20 years and older:  A BMI below 18.5 is considered  underweight.  A BMI of 18.5 to 24.9 is normal.  A BMI of 25 to 29.9 is considered overweight.  A BMI of 30 and above is considered obese.  Maintain normal blood lipids and cholesterol levels by exercising and minimizing your intake of saturated fat. Eat a balanced diet with plenty of fruit and vegetables. Blood tests for lipids and cholesterol should begin at age 69 and be repeated every 5 years. If your lipid or cholesterol levels are high, you are over 50, or you are at high risk for heart disease, you may need your cholesterol levels checked more frequently.Ongoing high lipid and cholesterol levels should be treated with medicines if diet and exercise are not working.  If you smoke, find out from your health care provider how to quit. If you do not use tobacco, do not start.  Lung cancer screening is recommended for adults aged 48-80 years who are at high risk for developing lung cancer because of a history of smoking. A yearly low-dose CT scan of the lungs is recommended for people who have at least a 30-pack-year history of smoking and are a current smoker or have quit within the past 15 years. A pack year of smoking is smoking an average of 1 pack of cigarettes a day for 1 year (for example: 1 pack a day for 30 years or 2 packs a day for 15 years). Yearly screening should continue until the smoker has stopped smoking for at least 15 years. Yearly screening should be stopped for people who develop a health problem that would prevent them from  having lung cancer treatment.  If you choose to drink alcohol, do not have more than 2 drinks per day. One drink is considered to be 12 ounces (355 mL) of beer, 5 ounces (148 mL) of wine, or 1.5 ounces (44 mL) of liquor.  Avoid use of street drugs. Do not share needles with anyone. Ask for help if you need support or instructions about stopping the use of drugs.  High blood pressure causes heart disease and increases the risk of stroke. Your blood  pressure should be checked at least every 1-2 years. Ongoing high blood pressure should be treated with medicines, if weight loss and exercise are not effective.  If you are 85-35 years old, ask your health care provider if you should take aspirin to prevent heart disease.  Diabetes screening involves taking a blood sample to check your fasting blood sugar level. This should be done once every 3 years, after age 52, if you are within normal weight and without risk factors for diabetes. Testing should be considered at a younger age or be carried out more frequently if you are overweight and have at least 1 risk factor for diabetes.  Colorectal cancer can be detected and often prevented. Most routine colorectal cancer screening begins at the age of 74 and continues through age 52. However, your health care provider may recommend screening at an earlier age if you have risk factors for colon cancer. On a yearly basis, your health care provider may provide home test kits to check for hidden blood in the stool. Use of a small camera at the end of a tube to directly examine the colon (sigmoidoscopy or colonoscopy) can detect the earliest forms of colorectal cancer. Talk to your health care provider about this at age 24, when routine screening begins. Direct exam of the colon should be repeated every 5-10 years through age 68, unless early forms of precancerous polyps or small growths are found.  People who are at an increased risk for hepatitis B should be screened for this virus. You are considered at high risk for hepatitis B if:  You were born in a country where hepatitis B occurs often. Talk with your health care provider about which countries are considered high risk.  Your parents were born in a high-risk country and you have not received a shot to protect against hepatitis B (hepatitis B vaccine).  You have HIV or AIDS.  You use needles to inject street drugs.  You live with, or have sex with,  someone who has hepatitis B.  You are a man who has sex with other men (MSM).  You get hemodialysis treatment.  You take certain medicines for conditions such as cancer, organ transplantation, and autoimmune conditions.  Hepatitis C blood testing is recommended for all people born from [birth year] through 1965 and any individual with known risks for hepatitis C.  Practice safe sex. Use condoms and avoid high-risk sexual practices to reduce the spread of sexually transmitted infections (STIs). STIs include gonorrhea, chlamydia, syphilis, trichomonas, herpes, HPV, and human immunodeficiency virus (HIV). Herpes, HIV, and HPV are viral illnesses that have no cure. They can result in disability, cancer, and death.  If you are at risk of being infected with HIV, it is recommended that you take a prescription medicine daily to prevent HIV infection. This is called preexposure prophylaxis (PrEP). You are considered at risk if:  You are a man who has sex with other men (MSM) and have other risk factors.  You are a heterosexual man, are sexually active, and are at increased risk for HIV infection.  You take drugs by injection.  You are sexually active with a partner who has HIV.  Talk with your health care provider about whether you are at high risk of being infected with HIV. If you choose to begin PrEP, you should first be tested for HIV. You should then be tested every 3 months for as long as you are taking PrEP.  A one-time screening for abdominal aortic aneurysm (AAA) and surgical repair of large AAAs by ultrasound are recommended for men ages 6 to 59 years who are current or former smokers.  Healthy men should no longer receive prostate-specific antigen (PSA) blood tests as part of routine cancer screening. Talk with your health care provider about prostate cancer screening.  Testicular cancer screening is not recommended for adult males who have no symptoms. Screening includes self-exam, a health  care provider exam, and other screening tests. Consult with your health care provider about any symptoms you have or any concerns you have about testicular cancer.  Use sunscreen. Apply sunscreen liberally and repeatedly throughout the day. You should seek shade when your shadow is shorter than you. Protect yourself by wearing long sleeves, pants, a wide-brimmed hat, and sunglasses year round, whenever you are outdoors.  Once a month, do a whole-body skin exam, using a mirror to look at the skin on your back. Tell your health care provider about new moles, moles that have irregular borders, moles that are larger than a pencil eraser, or moles that have changed in shape or color.  Stay current with required vaccines (immunizations).  Influenza vaccine. All adults should be immunized every year.  Tetanus, diphtheria, and acellular pertussis (Td, Tdap) vaccine. An adult who has not previously received Tdap or who does not know his vaccine status should receive 1 dose of Tdap. This initial dose should be followed by tetanus and diphtheria toxoids (Td) booster doses every 10 years. Adults with an unknown or incomplete history of completing a 3-dose immunization series with Td-containing vaccines should begin or complete a primary immunization series including a Tdap dose. Adults should receive a Td booster every 10 years.  Varicella vaccine. An adult without evidence of immunity to varicella should receive 2 doses or a second dose if he has previously received 1 dose.  Human papillomavirus (HPV) vaccine. Males aged 26-21 years who have not received the vaccine previously should receive the 3-dose series. Males aged 22-26 years may be immunized. Immunization is recommended through the age of 73 years for any male who has sex with males and did not get any or all doses earlier. Immunization is recommended for any person with an immunocompromised condition through the age of 1 years if he did not get any or  all doses earlier. During the 3-dose series, the second dose should be obtained 4-8 weeks after the first dose. The third dose should be obtained 24 weeks after the first dose and 16 weeks after the second dose.  Zoster vaccine. One dose is recommended for adults aged 48 years or older unless certain conditions are present.  Measles, mumps, and rubella (MMR) vaccine. Adults born before [birth year] generally are considered immune to measles and mumps. Adults born in [birth year] or later should have 1 or more doses of MMR vaccine unless there is a contraindication to the vaccine or there is laboratory evidence of immunity to each of the three diseases. A routine second dose of  MMR vaccine should be obtained at least 28 days after the first dose for students attending postsecondary schools, health care workers, or international travelers. People who received inactivated measles vaccine or an unknown type of measles vaccine during 1963-1967 should receive 2 doses of MMR vaccine. People who received inactivated mumps vaccine or an unknown type of mumps vaccine before 1979 and are at high risk for mumps infection should consider immunization with 2 doses of MMR vaccine. Unvaccinated health care workers born before [birth year] who lack laboratory evidence of measles, mumps, or rubella immunity or laboratory confirmation of disease should consider measles and mumps immunization with 2 doses of MMR vaccine or rubella immunization with 1 dose of MMR vaccine.  Pneumococcal 13-valent conjugate (PCV13) vaccine. When indicated, a person who is uncertain of his immunization history and has no record of immunization should receive the PCV13 vaccine. An adult aged 43 years or older who has certain medical conditions and has not been previously immunized should receive 1 dose of PCV13 vaccine. This PCV13 should be followed with a dose of pneumococcal polysaccharide (PPSV23) vaccine. The PPSV23 vaccine dose should be obtained at least 8 weeks after  the dose of PCV13 vaccine. An adult aged 77 years or older who has certain medical conditions and previously received 1 or more doses of PPSV23 vaccine should receive 1 dose of PCV13. The PCV13 vaccine dose should be obtained 1 or more years after the last PPSV23 vaccine dose.  Pneumococcal polysaccharide (PPSV23) vaccine. When PCV13 is also indicated, PCV13 should be obtained first. All adults aged 3 years and older should be immunized. An adult younger than age 77 years who has certain medical conditions should be immunized. Any person who resides in a nursing home or long-term care facility should be immunized. An adult smoker should be immunized. People with an immunocompromised condition and certain other conditions should receive both PCV13 and PPSV23 vaccines. People with human immunodeficiency virus (HIV) infection should be immunized as soon as possible after diagnosis. Immunization during chemotherapy or radiation therapy should be avoided. Routine use of PPSV23 vaccine is not recommended for American Indians, [place] Natives, or people younger than 65 years unless there are medical conditions that require PPSV23 vaccine. When indicated, people who have unknown immunization and have no record of immunization should receive PPSV23 vaccine. One-time revaccination 5 years after the first dose of PPSV23 is recommended for people aged 19-64 years who have chronic kidney failure, nephrotic syndrome, asplenia, or immunocompromised conditions. People who received 1-2 doses of PPSV23 before age 32 years should receive another dose of PPSV23 vaccine at age 49 years or later if at least 5 years have passed since the previous dose. Doses of PPSV23 are not needed for people immunized with PPSV23 at or after age 64 years.  Meningococcal vaccine. Adults with asplenia or persistent complement component deficiencies should receive 2 doses of quadrivalent meningococcal conjugate (MenACWY-D) vaccine. The doses should be  obtained at least 2 months apart. Microbiologists working with certain meningococcal bacteria, [place] recruits, people at risk during an outbreak, and people who travel to or live in countries with a high rate of meningitis should be immunized. A first-year college student up through age 81 years who is living in a residence hall should receive a dose if he did not receive a dose on or after his 16th birthday. Adults who have certain high-risk conditions should receive one or more doses of vaccine.  Hepatitis A vaccine. Adults who wish to be protected from this  disease, have certain high-risk conditions, work with hepatitis A-infected animals, work in hepatitis A research labs, or travel to or work in countries with a high rate of hepatitis A should be immunized. Adults who were previously unvaccinated and who anticipate close contact with an international adoptee during the first 60 days after arrival in the [place] from a country with a high rate of hepatitis A should be immunized.  Hepatitis B vaccine. Adults should be immunized if they wish to be protected from this disease, have certain high-risk conditions, may be exposed to blood or other infectious body fluids, are household contacts or sex partners of hepatitis B positive people, are clients or workers in certain care facilities, or travel to or work in countries with a high rate of hepatitis B.  Haemophilus influenzae type b (Hib) vaccine. A previously unvaccinated person with asplenia or sickle cell disease or having a scheduled splenectomy should receive 1 dose of Hib vaccine. Regardless of previous immunization, a recipient of a hematopoietic stem cell transplant should receive a 3-dose series 6-12 months after his successful transplant. Hib vaccine is not recommended for adults with HIV infection. Preventive Service / Frequency Ages 73 to 42  Blood pressure check.** / Every 1 to 2 years.  Lipid and cholesterol check.** / Every 5  years beginning at age 61.  Hepatitis C blood test.** / For any individual with known risks for hepatitis C.  Skin self-exam. / Monthly.  Influenza vaccine. / Every year.  Tetanus, diphtheria, and acellular pertussis (Tdap, Td) vaccine.** / Consult your health care provider. 1 dose of Td every 10 years.  Varicella vaccine.** / Consult your health care provider.  HPV vaccine. / 3 doses over 6 months, if 44 or younger.  Measles, mumps, rubella (MMR) vaccine.** / You need at least 1 dose of MMR if you were born in 1957 or later. You may also need a second dose.  Pneumococcal 13-valent conjugate (PCV13) vaccine.** / Consult your health care provider.  Pneumococcal polysaccharide (PPSV23) vaccine.** / 1 to 2 doses if you smoke cigarettes or if you have certain conditions.  Meningococcal vaccine.** / 1 dose if you are age 37 to 35 years and a Market researcher living in a residence hall, or have one of several medical conditions. You may also need additional booster doses.  Hepatitis A vaccine.** / Consult your health care provider.  Hepatitis B vaccine.** / Consult your health care provider.  Haemophilus influenzae type b (Hib) vaccine.** / Consult your health care provider. Ages 74 to 45  Blood pressure check.** / Every 1 to 2 years.  Lipid and cholesterol check.** / Every 5 years beginning at age 62.  Lung cancer screening. / Every year if you are aged 83-80 years and have a 30-pack-year history of smoking and currently smoke or have quit within the past 15 years. Yearly screening is stopped once you have quit smoking for at least 15 years or develop a health problem that would prevent you from having lung cancer treatment.  Fecal occult blood test (FOBT) of stool. / Every year beginning at age 52 and continuing until age 69. You may not have to do this test if you get a colonoscopy every 10 years.  Flexible sigmoidoscopy** or colonoscopy.** / Every 5 years for a flexible  sigmoidoscopy or every 10 years for a colonoscopy beginning at age 70 and continuing until age 94.  Hepatitis C blood test.** / For all people born from [birth year] through 1965 and any  individual with known risks for hepatitis C.  Skin self-exam. / Monthly.  Influenza vaccine. / Every year.  Tetanus, diphtheria, and acellular pertussis (Tdap/Td) vaccine.** / Consult your health care provider. 1 dose of Td every 10 years.  Varicella vaccine.** / Consult your health care provider.  Zoster vaccine.** / 1 dose for adults aged 60 years or older.  Measles, mumps, rubella (MMR) vaccine.** / You need at least 1 dose of MMR if you were born in 1959 or later. You may also need a second dose.  Pneumococcal 13-valent conjugate (PCV13) vaccine.** / Consult your health care provider.  Pneumococcal polysaccharide (PPSV23) vaccine.** / 1 to 2 doses if you smoke cigarettes or if you have certain conditions.  Meningococcal vaccine.** / Consult your health care provider.  Hepatitis A vaccine.** / Consult your health care provider.  Hepatitis B vaccine.** / Consult your health care provider.  Haemophilus influenzae type b (Hib) vaccine.** / Consult your health care provider. Ages 42 and over  Blood pressure check.** / Every 1 to 2 years.  Lipid and cholesterol check.**/ Every 5 years beginning at age 67.  Lung cancer screening. / Every year if you are aged 49-80 years and have a 30-pack-year history of smoking and currently smoke or have quit within the past 15 years. Yearly screening is stopped once you have quit smoking for at least 15 years or develop a health problem that would prevent you from having lung cancer treatment.  Fecal occult blood test (FOBT) of stool. / Every year beginning at age 90 and continuing until age 32. You may not have to do this test if you get a colonoscopy every 10 years.  Flexible sigmoidoscopy** or colonoscopy.** / Every 5 years for a flexible sigmoidoscopy or every 10  years for a colonoscopy beginning at age 24 and continuing until age 23.  Hepatitis C blood test.** / For all people born from [birth year] through 1967 and any individual with known risks for hepatitis C.  Abdominal aortic aneurysm (AAA) screening.** / A one-time screening for ages 50 to 43 years who are current or former smokers.  Skin self-exam. / Monthly.  Influenza vaccine. / Every year.  Tetanus, diphtheria, and acellular pertussis (Tdap/Td) vaccine.** / 1 dose of Td every 10 years.  Varicella vaccine.** / Consult your health care provider.  Zoster vaccine.** / 1 dose for adults aged 45 years or older.  Pneumococcal 13-valent conjugate (PCV13) vaccine.** / Consult your health care provider.  Pneumococcal polysaccharide (PPSV23) vaccine.** / 1 dose for all adults aged 27 years and older.  Meningococcal vaccine.** / Consult your health care provider.  Hepatitis A vaccine.** / Consult your health care provider.  Hepatitis B vaccine.** / Consult your health care provider.  Haemophilus influenzae type b (Hib) vaccine.** / Consult your health care provider. **Family history and personal history of risk and conditions may change your health care provider's recommendations. Document Released: 08/29/2001 Document Revised: 07/08/2013 Document Reviewed: 11/28/2010 [facility] Patient Information 2015 [place], [place]. This information is not intended to replace advice given to you by your health care provider. Make sure you discuss any questions you have with your health care provider.

## 2014-09-14 NOTE — Progress Notes (Signed)
Subjective:    Patient ID: Darlyn ReadCharles Dame, male    DOB: April 02, 1971, 44 y.o.   MRN: [id number]  HPI   Pt in for physical.  He gives me update that he did see the specialist for his pancreatitis. Gi recommended getting MRI.  Pt is exercising. He is doing [place] 3 times a week. 1 cup of coffee a day or energy drink. His diet is healthy. Baked fish, [place]key, and chicken. Fruits and vegetables.  No acute complaints today. No smoking and no alcohol.  HIV screen 2015.(negative in chart)    Review of Systems  Constitutional: Negative for fever, chills, diaphoresis, activity change and fatigue.  Respiratory: Negative for cough, chest tightness and shortness of breath.   Cardiovascular: Negative for chest pain, palpitations and leg swelling.  Gastrointestinal: Negative for nausea, vomiting and abdominal pain.  Genitourinary: Negative for dysuria, urgency, frequency, hematuria, decreased urine volume, discharge, penile swelling, scrotal swelling, difficulty urinating, genital sores, penile pain and testicular pain.  Musculoskeletal: Negative for back pain, neck pain and neck stiffness.  Neurological: Negative for dizziness, tremors, seizures, syncope, facial asymmetry, speech difficulty, weakness, light-headedness, numbness and headaches.  Psychiatric/Behavioral: Negative for behavioral problems, confusion and agitation. The patient is not nervous/anxious.    Past Medical History  Diagnosis Date  . Medical history non-contributory   . Pancreatitis     No alcohol use and no gallbladder disease.  Marland Kitchen. GERD (gastroesophageal reflux disease)     given dx before pancreatitis.    History   Social History  . Marital Status: Married    Spouse Name: N/A  . Number of Children: 0  . Years of Education: N/A   Occupational History  . Delivery Driver    Social History Main Topics  . Smoking status: Never Smoker   . Smokeless tobacco: Never Used  . Alcohol Use: No  . Drug Use: No  .  Sexual Activity: Yes   Other Topics Concern  . Not on file   Social History Narrative    Past Surgical History  Procedure Laterality Date  . No past surgeries      Family History  Problem Relation Age of Onset  . Emphysema Father   . Colon cancer Neg Hx   . Colon polyps Neg Hx   . Kidney disease Father   . Diabetes Neg Hx   . Esophageal cancer Neg Hx   . Heart disease Neg Hx   . Gallbladder disease Neg Hx     Allergies  Allergen Reactions  . Codeine Nausea And Vomiting    Current Outpatient Prescriptions on File Prior to Visit  Medication Sig Dispense Refill  . magnesium oxide (MAG-OX) 400 MG tablet Take 12,000 mg by mouth daily as needed (for constipation).     No current facility-administered medications on file prior to visit.    BP 137/83 mmHg  Pulse 70  Temp(Src) 98.3 F (36.8 C) (Oral)  Ht 5' 11.75" (1.822 m)  Wt 250 lb (113.399 kg)  BMI 34.16 kg/m2  SpO2 97%       Objective:   Physical Exam  General Mental Status- Alert. Orientation- Oriented x3.  Build and Nutrition- Well nourished and Well Developed.  Skin General:-Normal. Color- Normal color. Moisture- Normal. Temperature-Warm.  HEENT  Ears- Normal. Auditory Canal- Bilateral-Normal. Tympanic Membrane- Bilateral-Normal. Eye Fundi-Bilateral-Normal. Pupil- bilateral- Direct reaction to light normal. Nose & Sinuses- Normal. Nostrils-Bilateral- Normal. Mouth & Throat-Normal.  Neck Neck- No Bruits or Masses. Trachea midline.  Thyroid- Normal.  Chest  and Lung Exam Percussion: Quality and Intensity-Percussion normal. Percussion of the chest reveals- No Dullness.  Palpation: Palpation of the chest reveals- Non-tender- No dullness. Auscultation: Breath Sounds- Normal.  Adventitous Sounds:-No adventitious sounds.  Cardiovascular Inspection:- No Heaves. Auscultation:-Normal sinus rhythm without murmur gallop, S1 WNL and S2 WNL.  Abdomen Inspection:-Inspection Normal. Inspection of the  abdomen reveals- No hernias Palpation/Percussion:- Palpation and Percussion of the Abdomen reveal- Non Tender and No Palpable abdominal masses. Liver: Other Characteristics- No hepatomegaly. Spleen:Other Characteristics- No Splenomegaly. Auscultation:- Auscultation of the abdomen reveals- Bowel sounds normal and No Abdominal bruits.  Male Genitourinary Urethra:- No discharge. Penis- Circumcised. Scrotum- No masses. Testes- Bilateral-Normal.   Peripheral  Vascular Lower Extremity:Inspection- Bilateral-Inspection Normal    Neurologic Mental Status:- Normal. Cranial Nerves:-Normal Bilaterally. Motor:-Normal. Strength:5/5 normal muscle strength-All Muscles. General Assessment of Reflexes: Right Knee-2+. Left Knee- 2+. Coordination-Normal. Gait- Normal.  Meningeal Signs- None.  Musculoskeletal Global Assessment General-Joints show full range of motion without obvious deformity and Normal muscle mass. Strength in upper and lower extremities.  Lymphatics General lymphatics Description- No generalized lymphadenopathy.      Assessment & Plan:

## 2014-09-16 ENCOUNTER — Ambulatory Visit (HOSPITAL_COMMUNITY): Payer: PRIVATE HEALTH INSURANCE

## 2014-10-19 ENCOUNTER — Other Ambulatory Visit: Payer: Self-pay | Admitting: Gastroenterology

## 2014-10-19 ENCOUNTER — Ambulatory Visit (HOSPITAL_COMMUNITY)
Admission: RE | Admit: 2014-10-19 | Discharge: 2014-10-19 | Disposition: A | Discharge status: Home / Self Care | Payer: Managed Care, Other (non HMO) | Source: Ambulatory Visit | Attending: Gastroenterology | Admitting: Gastroenterology

## 2014-10-19 DIAGNOSIS — K859 Acute pancreatitis, unspecified: Secondary | ICD-10-CM

## 2014-10-19 DIAGNOSIS — R109 Unspecified abdominal pain: Secondary | ICD-10-CM

## 2015-05-05 IMAGING — US US ABDOMEN COMPLETE
1 series · 13 of 25 positions shown · non-contrast
Comparison: None.

CLINICAL DATA: Abdominal pain ; pancreatitis and transaminitis

EXAM:
ULTRASOUND ABDOMEN COMPLETE ; the study was limited due to the
patient's body habitus and bowel gas.

[Series 1: us abdomen complete · 0.27mm/px · 13 of 64 slices shown]
[im 1/64]
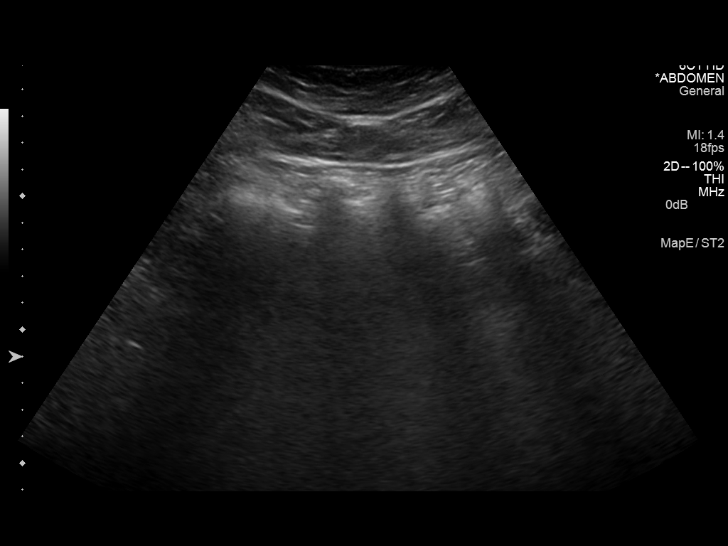
[im 6/64]
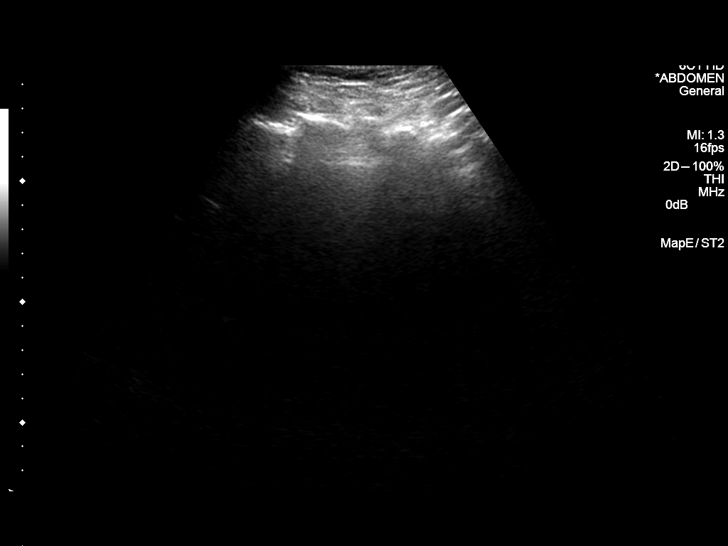
[im 11/64]
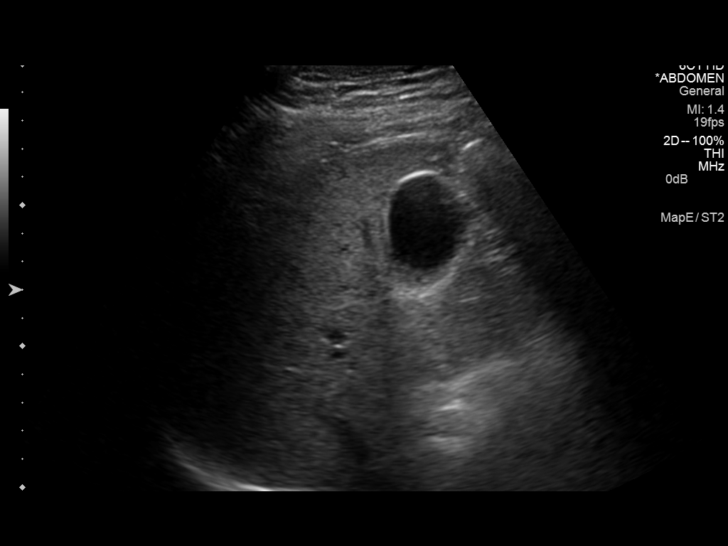
[im 16/64]
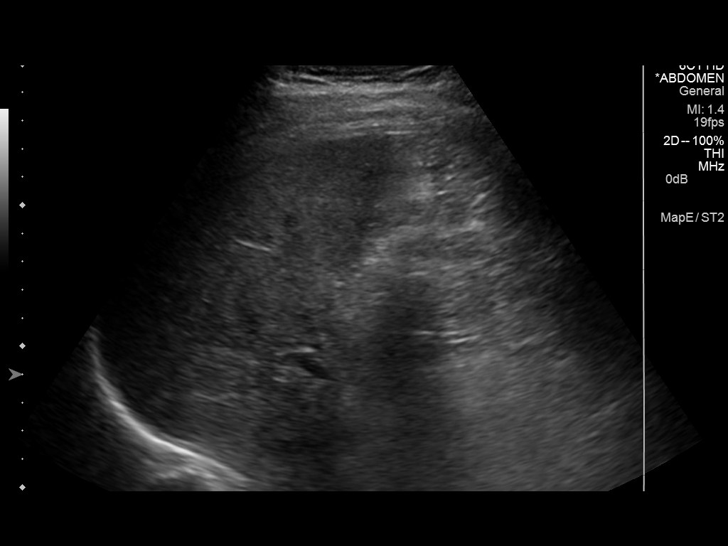
[im 22/64]
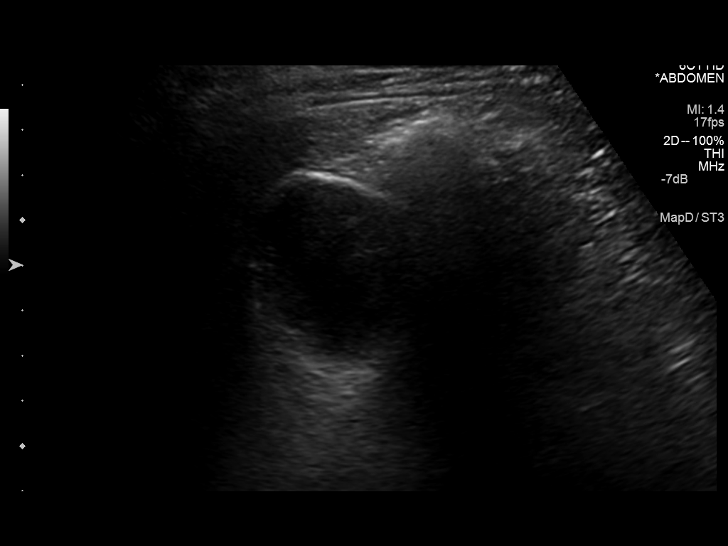
[im 27/64]
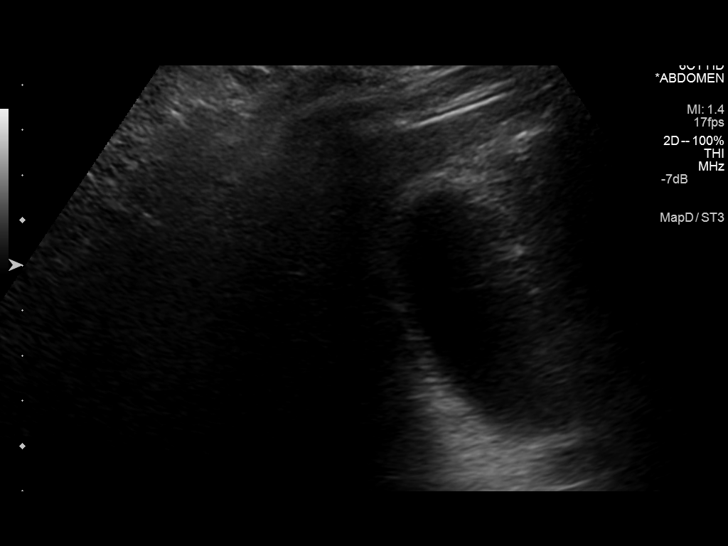
[im 32/64]
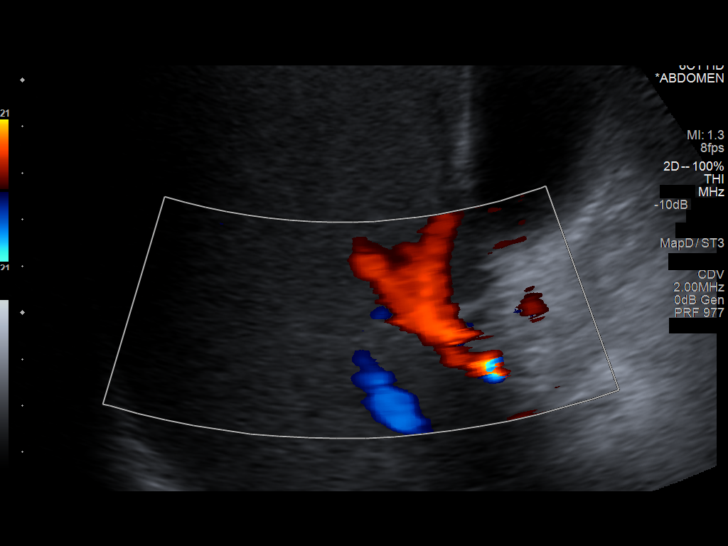
[im 37/64]
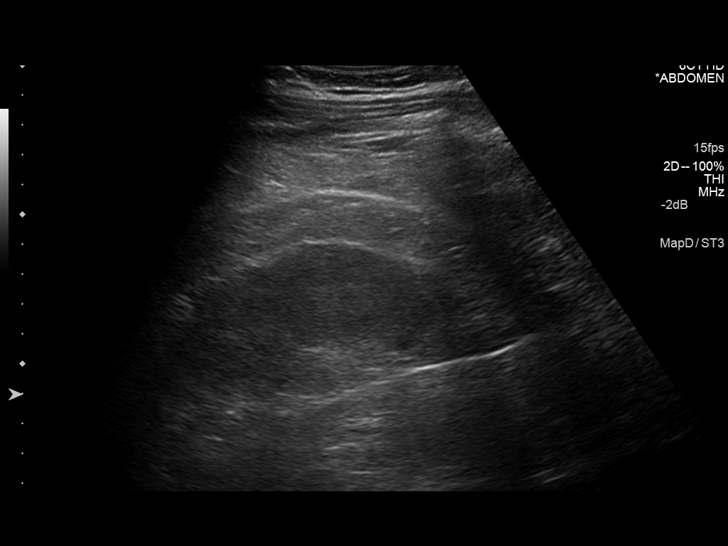
[im 43/64]
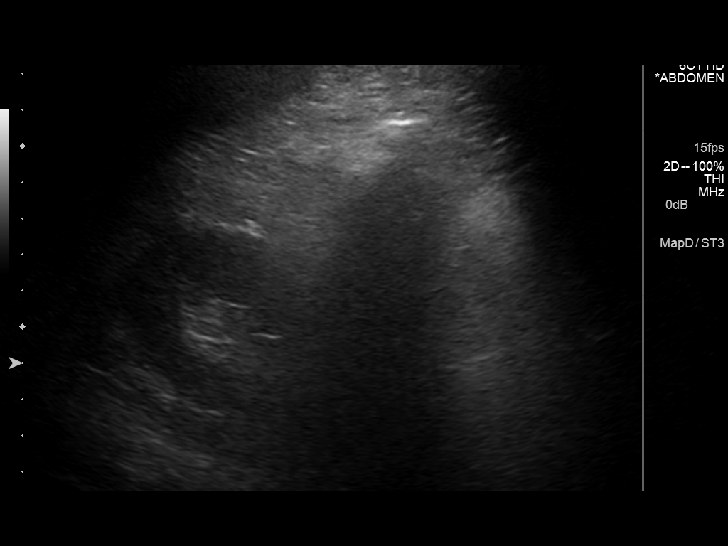
[im 48/64]
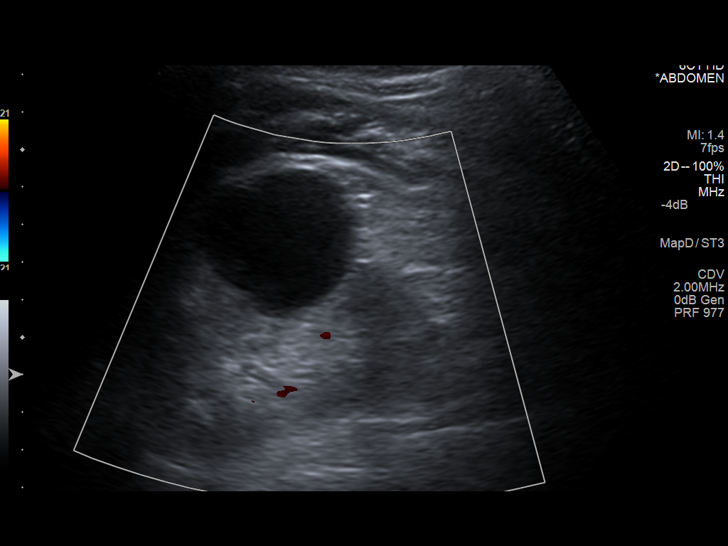
[im 53/64]
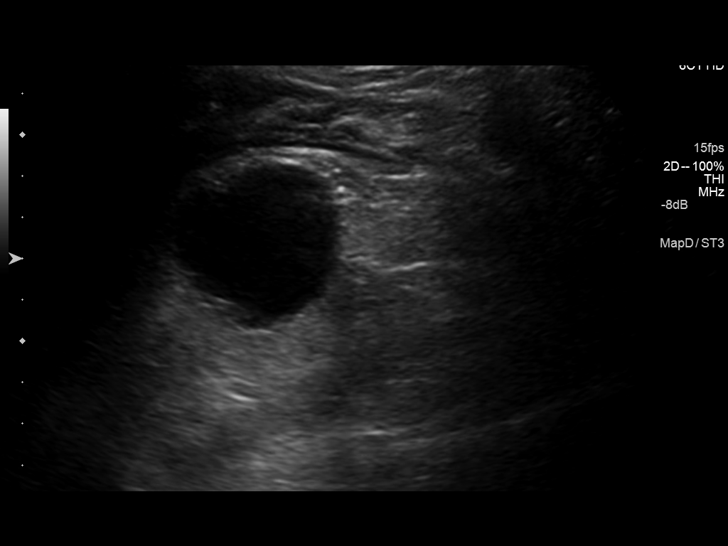
[im 58/64]
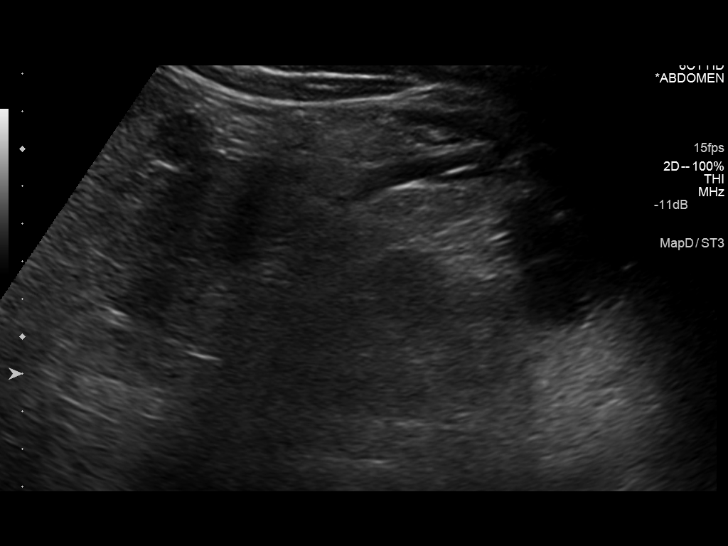
[im 64/64]
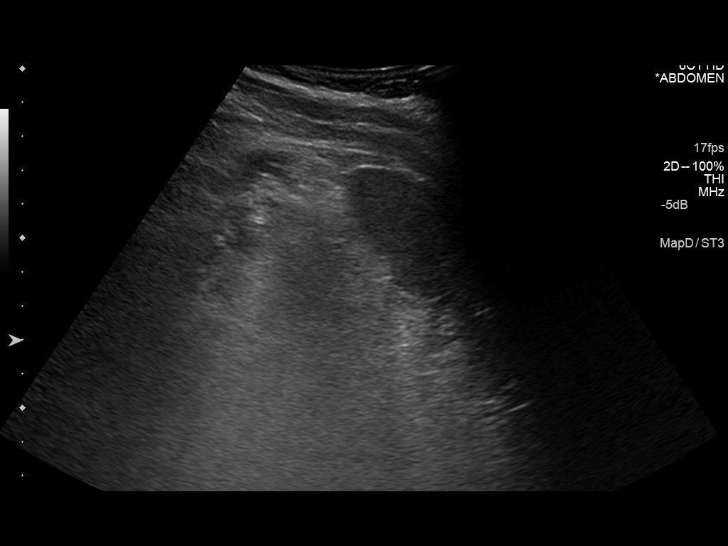

[13 of 25 positions shown; findings below may reference images not displayed]

FINDINGS: Gallbladder: No gallstones or wall thickening visualized. No
sonographic Murphy sign noted.

Common bile duct: Diameter: 2.5 mm

Liver: No focal lesion identified, but the left lobe was obscured by
bowel gas. The hepatic echotexture is increased.

IVC: No abnormality visualized.

Pancreas: Obscured by bowel gas.

Spleen: Size and appearance within normal limits.

Right Kidney: Length: 11.6 cm. Echogenicity within normal limits. No
mass or hydronephrosis visualized.

Left Kidney: Length: 11.2 cm. Echogenicity within normal limits.
There is a simple appearing midpole cyst measuring 4.2 cm in
greatest dimension. No hydronephrosis visualized.

Abdominal aorta: Obscured by bowel gas.

Other findings: None.
IMPRESSION: 1. The pancreas and left hepatic lobe were obscured by bowel gas.
The observed portions of the liver or normal. The gallbladder
exhibits no evidence of stones.
2. There is a simple appearing cyst in the midpole of the left
kidney.
3. Further evaluation with abdominal and pelvic CT scanning may be
useful in further evaluating the pancreas and liver.

## 2015-05-20 ENCOUNTER — Ambulatory Visit (INDEPENDENT_AMBULATORY_CARE_PROVIDER_SITE_OTHER): Payer: Managed Care, Other (non HMO) | Admitting: Medical

## 2015-05-20 ENCOUNTER — Encounter: Payer: Self-pay | Admitting: Medical

## 2015-05-20 VITALS — BP 124/80 | HR 65 | Temp 98.1°F | Ht 71.75 in | Wt 263.0 lb

## 2015-05-20 DIAGNOSIS — H00016 Hordeolum externum left eye, unspecified eyelid: Secondary | ICD-10-CM | POA: Diagnosis not present

## 2015-05-20 DIAGNOSIS — L089 Local infection of the skin and subcutaneous tissue, unspecified: Secondary | ICD-10-CM | POA: Diagnosis not present

## 2015-05-20 MED ORDER — CEPHALEXIN 500 MG PO CAPS: 500.0000 mg | ORAL_CAPSULE | Freq: Two times a day (BID) | ORAL | Status: DC

## 2015-05-20 MED ORDER — MOXIFLOXACIN HCL 0.5 % OP SOLN: 1.0000 [drp] | Freq: Three times a day (TID) | OPHTHALMIC | Status: DC

## 2015-05-20 NOTE — Patient Instructions (Signed)
You appear to have eye lid infection secondary to the trauma you report. Also stye present as well.  Will rx vigamox drops. Use warm compresses twice daily. If by Friday evening not improving then start oral keflex.  Regarding you mri abdomen if you do decide will get and need ativan call me and will have rx for you.  Follow up in 7 days or as needed.

## 2015-05-20 NOTE — Progress Notes (Signed)
Pre visit review using our clinic review tool, if applicable. No additional management support is needed unless otherwise documented below in the visit note. 

## 2015-05-20 NOTE — Progress Notes (Signed)
Subjective:    Patient ID: Philip ReadCharles Pearson, male    DOB: 12-12-1970, 44 y.o.   MRN: [id number]  HPI  Pt in with some left eye irritation. He was rubbing his eye. He states when rubbing his eye he felt thumb kind of of lift up lid aggressively by accident. Pt states he felt mild abrasion type injury day before yesterday. Maybe mild scant dc this am. No light sensitivity. No vision changes.  Also pt update me that his Gi doctor wants him to get mri. But pt could not tolerate being in machine. He mentions he may need benzo type med prior to imaging study.      Review of Systems  Constitutional: Negative for fever, chills, diaphoresis, activity change and fatigue.  Eyes:       Slight matting to left eye. Mild irritation. His left upper eye lid mild swollen.  Respiratory: Negative for cough, chest tightness and shortness of breath.   Cardiovascular: Negative for chest pain, palpitations and leg swelling.  Gastrointestinal: Negative for nausea, vomiting and abdominal pain.  Musculoskeletal: Negative for back pain, neck pain and neck stiffness.  Neurological: Negative for dizziness, seizures, facial asymmetry, weakness and headaches.  Hematological: Negative for adenopathy. Does not bruise/bleed easily.  Psychiatric/Behavioral: Negative for behavioral problems, confusion and agitation. The patient is not nervous/anxious.     Past Medical History  Diagnosis Date  . Medical history non-contributory   . Pancreatitis     No alcohol use and no gallbladder disease.  Marland Kitchen. GERD (gastroesophageal reflux disease)     given dx before pancreatitis.    Social History   Social History  . Marital Status: Married    Spouse Name: N/A  . Number of Children: 0  . Years of Education: N/A   Occupational History  . Delivery Driver    Social History Main Topics  . Smoking status: Never Smoker   . Smokeless tobacco: Never Used  . Alcohol Use: No  . Drug Use: No  . Sexual Activity: Yes   Other  Topics Concern  . Not on file   Social History Narrative    Past Surgical History  Procedure Laterality Date  . No past surgeries      Family History  Problem Relation Age of Onset  . Emphysema Father   . Colon cancer Neg Hx   . Colon polyps Neg Hx   . Kidney disease Father   . Diabetes Neg Hx   . Esophageal cancer Neg Hx   . Heart disease Neg Hx   . Gallbladder disease Neg Hx     Allergies  Allergen Reactions  . Codeine Nausea And Vomiting    No current outpatient prescriptions on file prior to visit.   No current facility-administered medications on file prior to visit.    BP 124/80 mmHg  Pulse 65  Temp(Src) 98.1 F (36.7 C) (Oral)  Ht 5' 11.75" (1.822 m)  Wt 263 lb (119.296 kg)  BMI 35.94 kg/m2  SpO2 97%       Objective:   Physical Exam  General  Mental Status - Alert. General Appearance - Well groomed. Not in acute distress.  Skin Rashes- No Rashes.  HEENT   Head- Normal.  Eyes- Perrla, eom intact,  Lt eye-Conjunctiva clear, No DC. Pt left upper eye lid upper medial area moderate red and swollen. Faint tender. Also at edge of upper eyelid also feels like stye may be present. Fluorescein stain eye exam showed no abrasion. Pt  Chest and Lung Exam Auscultation: Breath Sounds:- even and unlabored, but bilateral upper lobe rhonchi.  Cardiovascular Auscultation:Rythm- Regular, rate and rhythm. Murmurs & Other Heart Sounds:Ausculatation of the heart reveal- No Murmurs.  Lymphatic Head & Neck General Head & Neck Lymphatics: Bilateral: Description- No Localized lymphadenopathy.        Assessment & Plan:  You appear to have eye lid infection secondary to the trauma you report. Also stye present as well.  Will rx vigamox drops. Use warm compresses twice daily. If by Friday evening not improving then start oral keflex.  Regarding you mri abdomen if you do decide will get and need ativan call me and will have rx for you.  Follow up  in 7 days or as needed.

## 2015-05-30 ENCOUNTER — Encounter: Payer: Self-pay | Admitting: Medical

## 2015-05-30 DIAGNOSIS — R635 Abnormal weight gain: Secondary | ICD-10-CM

## 2015-05-31 NOTE — Telephone Encounter (Signed)
Thyroid labs placed.

## 2015-09-08 ENCOUNTER — Telehealth: Payer: Self-pay | Admitting: Medical

## 2015-09-08 NOTE — Telephone Encounter (Signed)
Last flu shot 08/2014. Pt will check work schedule and call back to schedule cpe & get flu shot then.

## 2016-08-25 ENCOUNTER — Ambulatory Visit (INDEPENDENT_AMBULATORY_CARE_PROVIDER_SITE_OTHER): Payer: Managed Care, Other (non HMO) | Admitting: Medical

## 2016-08-25 ENCOUNTER — Encounter: Payer: Self-pay | Admitting: Medical

## 2016-08-25 VITALS — BP 132/92 | HR 80 | Temp 97.9°F | Resp 16 | Ht 71.75 in | Wt 282.5 lb

## 2016-08-25 DIAGNOSIS — J40 Bronchitis, not specified as acute or chronic: Secondary | ICD-10-CM

## 2016-08-25 DIAGNOSIS — R05 Cough: Secondary | ICD-10-CM | POA: Diagnosis not present

## 2016-08-25 DIAGNOSIS — J0100 Acute maxillary sinusitis, unspecified: Secondary | ICD-10-CM

## 2016-08-25 DIAGNOSIS — R059 Cough, unspecified: Secondary | ICD-10-CM

## 2016-08-25 MED ORDER — HYDROCODONE-HOMATROPINE 5-1.5 MG/5ML PO SYRP: 5.0000 mL | ORAL_SOLUTION | Freq: Three times a day (TID) | ORAL | 0 refills | Status: DC | PRN

## 2016-08-25 MED ORDER — FLUTICASONE PROPIONATE 50 MCG/ACT NA SUSP: 2.0000 | Freq: Every day | NASAL | 1 refills | Status: DC

## 2016-08-25 MED ORDER — DOXYCYCLINE HYCLATE 100 MG PO TABS: 100.0000 mg | ORAL_TABLET | Freq: Two times a day (BID) | ORAL | 0 refills | Status: DC

## 2016-08-25 MED FILL — DOXYCYCLINE HYCLATE 100 MG: 100 | 10 days supply | Qty: 20 | Fill #0

## 2016-08-25 MED FILL — FLUTICASONE PROP 50 MCG SPR: 50 | 30 days supply | Qty: 16 | Fill #0

## 2016-08-25 MED FILL — HYDROCODONE-HOMATROPINE SYR: 5-1.5 | 8 days supply | Qty: 120 | Fill #0

## 2016-08-25 NOTE — Patient Instructions (Addendum)
You appear to have bronchitis and sinusitis. Rest hydrate and tylenol for fever. I am prescribing cough medicine hycodan, and doxycycline antibiotic. For your nasal congestion rx flonase  You should gradually get better. If not then notify us and would recommend a chest xray.  Follow up in 7-10 days or as needed

## 2016-08-25 NOTE — Progress Notes (Signed)
Pre visit review using our clinic review tool, if applicable. No additional management support is needed unless otherwise documented below in the visit note/SLS  

## 2016-08-25 NOTE — Progress Notes (Signed)
Subjective:    Patient ID: Philip Pearson, male    DOB: [birth year], 46 y.o.   MRN: [id number]  HPI   Pt in for recent head congestion, sinus pressure , chest congestion ,ear pressure, pnd, and some mild productive for one month. Some mucous when he blows his nose.   Pt states some better but can't get over this completley. Pt tried dayquil and nyquil otc.  No fever, no chills, no sweats or bodyaches.   No wheezing.  Pt states not sleeping well due to the cough. Feels drip on back of his throat at night.    Review of Systems  Constitutional: Negative for chills, fatigue and fever.  HENT: Positive for congestion, postnasal drip, sinus pain and sinus pressure. Negative for ear pain and sore throat.   Respiratory: Positive for cough. Negative for chest tightness, shortness of breath and wheezing.   Cardiovascular: Negative for chest pain and palpitations.  Gastrointestinal: Negative for abdominal pain.  Genitourinary: Negative for flank pain and frequency.  Musculoskeletal: Negative for back pain, neck pain and neck stiffness.  Skin: Negative for rash.  Neurological: Negative for dizziness, weakness and headaches.  Hematological: Negative for adenopathy. Does not bruise/bleed easily.  Psychiatric/Behavioral: Negative for behavioral problems and confusion.    Past Medical History:  Diagnosis Date  . GERD (gastroesophageal reflux disease)    given dx before pancreatitis.  . Medical history non-contributory   . Pancreatitis    No alcohol use and no gallbladder disease.     Social History   Social History  . Marital status: Married    Spouse name: N/A  . Number of children: 0  . Years of education: N/A   Occupational History  . Delivery Driver    Social History Main Topics  . Smoking status: Never Smoker  . Smokeless tobacco: Never Used  . Alcohol use No  . Drug use: No  . Sexual activity: Yes   Other Topics Concern  . Not on file   Social History  Narrative  . No narrative on file    Past Surgical History:  Procedure Laterality Date  . NO PAST SURGERIES      Family History  Problem Relation Age of Onset  . Emphysema Father   . Colon cancer Neg Hx   . Colon polyps Neg Hx   . Kidney disease Father   . Diabetes Neg Hx   . Esophageal cancer Neg Hx   . Heart disease Neg Hx   . Gallbladder disease Neg Hx     Allergies  Allergen Reactions  . Codeine Nausea And Vomiting    No current outpatient prescriptions on file prior to visit.   No current facility-administered medications on file prior to visit.     BP (!) 132/92 (BP Location: Left Arm, Patient Position: Sitting, Cuff Size: Large)   Pulse 80   Temp 97.9 F (36.6 C) (Oral)   Resp 16   Ht 5' 11.75" (1.822 m)   Wt 282 lb 8 oz (128.1 kg)   SpO2 95%   BMI 38.58 kg/m       Objective:   Physical Exam   General  Mental Status - Alert. General Appearance - Well groomed. Not in acute distress.  Skin Rashes- No Rashes.  HEENT Head- Normal. Ear Auditory Canal - Left- Normal. Right - Normal.Tympanic Membrane- Left- Normal. Right- Normal. Eye Sclera/Conjunctiva- Left- Normal. Right- Normal. Nose & Sinuses Nasal Mucosa- Left-  Boggy and Congested. Right-  Boggy and  Congested.Bilateral  Faint maxillary and faint frontal sinus pressure. Mouth & Throat Lips: Upper Lip- Normal: no dryness, cracking, pallor, cyanosis, or vesicular eruption. Lower Lip-Normal: no dryness, cracking, pallor, cyanosis or vesicular eruption. Buccal Mucosa- Bilateral- No Aphthous ulcers. Oropharynx- No Discharge or Erythema. Tonsils: Characteristics- Bilateral- No Erythema or Congestion. Size/Enlargement- Bilateral- No enlargement. Discharge- bilateral-None.  Neck Neck- Supple. No Masses.   Chest and Lung Exam Auscultation: Breath Sounds:-Clear even and unlabored.  Cardiovascular Auscultation:Rythm- Regular, rate and rhythm. Murmurs & Other Heart Sounds:Ausculatation of the  heart reveal- No Murmurs.  Lymphatic Head & Neck General Head & Neck Lymphatics: Bilateral: Description- No Localized lymphadenopathy.      Assessment & Plan:  You appear to have bronchitis and sinusitis. Rest hydrate and tylenol for fever. I am prescribing cough medicine hycodan, and doxycycline antibiotic. For your nasal congestion rx flonase  You should gradually get better. If not then notify us and would recommend a chest xray.  Follow up in 7-10 days or as needed

## 2018-06-07 ENCOUNTER — Ambulatory Visit (INDEPENDENT_AMBULATORY_CARE_PROVIDER_SITE_OTHER): Payer: Commercial Managed Care - PPO | Admitting: Medical

## 2018-06-07 ENCOUNTER — Encounter: Payer: Self-pay | Admitting: Medical

## 2018-06-07 VITALS — BP 130/80 | HR 78 | Temp 97.7°F | Resp 16 | Ht 72.0 in | Wt 288.0 lb

## 2018-06-07 DIAGNOSIS — R5383 Other fatigue: Secondary | ICD-10-CM | POA: Diagnosis not present

## 2018-06-07 DIAGNOSIS — Z1283 Encounter for screening for malignant neoplasm of skin: Secondary | ICD-10-CM | POA: Diagnosis not present

## 2018-06-07 DIAGNOSIS — Z0000 Encounter for general adult medical examination without abnormal findings: Secondary | ICD-10-CM | POA: Diagnosis not present

## 2018-06-07 DIAGNOSIS — L989 Disorder of the skin and subcutaneous tissue, unspecified: Secondary | ICD-10-CM | POA: Diagnosis not present

## 2018-06-07 LAB — POC URINALSYSI DIPSTICK (AUTOMATED)
BILIRUBIN UA: NEGATIVE
Blood, UA: NEGATIVE
GLUCOSE UA: NEGATIVE
LEUKOCYTES UA: NEGATIVE
NITRITE UA: NEGATIVE
PH UA: 6 (ref 5.0–8.0)
Protein, UA: POSITIVE — AB
Spec Grav, UA: >=1.03 — AB (ref 1.010–1.025)
Urobilinogen, UA: 1 E.U./dL

## 2018-06-07 NOTE — Progress Notes (Signed)
Subjective:    Patient ID: Philip Pearson, male    DOB: 05-02-1971, 47 y.o.   MRN: [id number]  HPI  Pt in for physical/cpe.  Pt is fasting.  Pt declines flu vaccine.  Pt planing to start exercising. 2 sodas a day. Used to drink a lot more. Eating better recently. Married- has strep children.  No acute complaints today. No smoking and no alcohol.  HIV screen 2015.(negative in chart)   Review of Systems  Constitutional: Positive for fatigue. Negative for chills and fever.       Mild and want thyroid checked.  HENT: Negative for congestion, ear pain, postnasal drip and rhinorrhea.   Eyes: Negative for photophobia.  Respiratory: Negative for cough, shortness of breath and wheezing.   Cardiovascular: Negative for chest pain, palpitations and leg swelling.  Gastrointestinal: Negative for abdominal pain.  Genitourinary: Negative for dysuria, flank pain, frequency and testicular pain.  Musculoskeletal: Negative for back pain and neck pain.  Skin: Negative for rash.  Neurological: Negative for dizziness, tremors, seizures and weakness.  Hematological: Negative for adenopathy. Does not bruise/bleed easily.  Psychiatric/Behavioral: Negative for agitation, confusion and sleep disturbance. The patient is not nervous/anxious.     Past Medical History:  Diagnosis Date  . GERD (gastroesophageal reflux disease)    given dx before pancreatitis.  . Medical history non-contributory   . Pancreatitis    No alcohol use and no gallbladder disease.     Social History   Socioeconomic History  . Marital status: Married    Spouse name: Not on file  . Number of children: 0  . Years of education: Not on file  . Highest education level: Not on file  Occupational History  . Occupation: [employer]  Social Needs  . Financial resource strain: Not on file  . Food insecurity:    Worry: Not on file    Inability: Not on file  . Transportation needs:    Medical: Not on file   Non-medical: Not on file  Tobacco Use  . Smoking status: Never Smoker  . Smokeless tobacco: Never Used  Substance and Sexual Activity  . Alcohol use: No    Alcohol/week: 0.0 standard drinks  . Drug use: No  . Sexual activity: Yes  Lifestyle  . Physical activity:    Days per week: Not on file    Minutes per session: Not on file  . Stress: Not on file  Relationships  . Social connections:    Talks on phone: Not on file    Gets together: Not on file    Attends religious service: Not on file    Active member of club or organization: Not on file    Attends meetings of clubs or organizations: Not on file    Relationship status: Not on file  . Intimate partner violence:    Fear of current or ex partner: Not on file    Emotionally abused: Not on file    Physically abused: Not on file    Forced sexual activity: Not on file  Other Topics Concern  . Not on file  Social History Narrative  . Not on file    Past Surgical History:  Procedure Laterality Date  . NO PAST SURGERIES      Family History  Problem Relation Age of Onset  . Emphysema Father   . Colon cancer Neg Hx   . Colon polyps Neg Hx   . Kidney disease Father   . Diabetes Neg Hx   .  Esophageal cancer Neg Hx   . Heart disease Neg Hx   . Gallbladder disease Neg Hx     Allergies  Allergen Reactions  . Codeine Nausea And Vomiting    No current outpatient medications on file prior to visit.   No current facility-administered medications on file prior to visit.     BP (!) 132/95   Pulse 78   Temp 97.7 F (36.5 C) (Oral)   Resp 16   Ht 6' (1.829 m)   Wt 288 lb (130.6 kg)   SpO2 95%   BMI 39.06 kg/m       Objective:   Physical Exam  General Mental Status- Alert. General Appearance- Not in acute distress.   Skin General: Color- Normal Color. Moisture- Normal Moisture. No worrisome lesions. Scattered small moles Rt side back moderate large likely seborrheic keratosis.  Neck Carotid Arteries-  Normal color. Moisture- Normal Moisture. No carotid bruits. No JVD.  Chest and Lung Exam Auscultation: Breath Sounds:-Normal.  Cardiovascular Auscultation:Rythm- Regular. Murmurs & Other Heart Sounds:Auscultation of the heart reveals- No Murmurs.  Abdomen Inspection:-Inspeection Normal. Palpation/Percussion:Note:No mass. Palpation and Percussion of the abdomen reveal- Non Tender, Non Distended + BS, no rebound or guarding.   Neurologic Cranial Nerve exam:- CN III-XII intact(No nystagmus), symmetric smile. Strength:- 5/5 equal and symmetric strength both upper and lower extremities.  Rectal and genital exam- deferred.       Assessment & Plan:  For you wellness exam today I have ordered cbc, cmp, tsh, lipid panel, and ua.  Flu vaccine declined.  Recommend exercise and healthy diet.  We will let you know lab results as they come in.  Follow up date appointment will be determined after lab review.

## 2018-06-07 NOTE — Patient Instructions (Addendum)
For you wellness exam today I have ordered cbc, cmp, tsh, lipid panel, and ua.  Flu vaccine declined.  Recommend exercise and healthy diet.  We will let you know lab results as they come in.  Follow up date appointment will be determined after lab review.   Referred to derm for screening and to evaluate seborrheic keratosis.   Preventive Care 40-64 Years, Male Preventive care refers to lifestyle choices and visits with your health care provider that can promote health and wellness. What does preventive care include?  A yearly physical exam. This is also called an annual well check.  Dental exams once or twice a year.  Routine eye exams. Ask your health care provider how often you should have your eyes checked.  Personal lifestyle choices, including: ? Daily care of your teeth and gums. ? Regular physical activity. ? Eating a healthy diet. ? Avoiding tobacco and drug use. ? Limiting alcohol use. ? Practicing safe sex. ? Taking low-dose aspirin every day starting at age 102. What happens during an annual well check? The services and screenings done by your health care provider during your annual well check will depend on your age, overall health, lifestyle risk factors, and family history of disease. Counseling Your health care provider may ask you questions about your:  Alcohol use.  Tobacco use.  Drug use.  Emotional well-being.  Home and relationship well-being.  Sexual activity.  Eating habits.  Work and work Statistician.  Screening You may have the following tests or measurements:  Height, weight, and BMI.  Blood pressure.  Lipid and cholesterol levels. These may be checked every 5 years, or more frequently if you are over 72 years old.  Skin check.  Lung cancer screening. You may have this screening every year starting at age 60 if you have a 30-pack-year history of smoking and currently smoke or have quit within the past 15 years.  Fecal occult  blood test (FOBT) of the stool. You may have this test every year starting at age 43.  Flexible sigmoidoscopy or colonoscopy. You may have a sigmoidoscopy every 5 years or a colonoscopy every 10 years starting at age 75.  Prostate cancer screening. Recommendations will vary depending on your family history and other risks.  Hepatitis C blood test.  Hepatitis B blood test.  Sexually transmitted disease (STD) testing.  Diabetes screening. This is done by checking your blood sugar (glucose) after you have not eaten for a while (fasting). You may have this done every 1-3 years.  Discuss your test results, treatment options, and if necessary, the need for more tests with your health care provider. Vaccines Your health care provider may recommend certain vaccines, such as:  Influenza vaccine. This is recommended every year.  Tetanus, diphtheria, and acellular pertussis (Tdap, Td) vaccine. You may need a Td booster every 10 years.  Varicella vaccine. You may need this if you have not been vaccinated.  Zoster vaccine. You may need this after age 37.  Measles, mumps, and rubella (MMR) vaccine. You may need at least one dose of MMR if you were born in 1957 or later. You may also need a second dose.  Pneumococcal 13-valent conjugate (PCV13) vaccine. You may need this if you have certain conditions and have not been vaccinated.  Pneumococcal polysaccharide (PPSV23) vaccine. You may need one or two doses if you smoke cigarettes or if you have certain conditions.  Meningococcal vaccine. You may need this if you have certain conditions.  Hepatitis A  conditions.  Hepatitis A vaccine. You may need this if you have certain conditions or if you travel or work in places where you may be exposed to hepatitis A.  Hepatitis B vaccine. You may need this if you have certain conditions or if you travel or work in places where you may be exposed to hepatitis B.  Haemophilus influenzae type b (Hib) vaccine. You may need  this if you have certain risk factors.  Talk to your health care provider about which screenings and vaccines you need and how often you need them. This information is not intended to replace advice given to you by your health care provider. Make sure you discuss any questions you have with your health care provider. Document Released: 07/30/2015 Document Revised: 03/22/2016 Document Reviewed: 05/04/2015 Elsevier Interactive Patient Education  2018 [employer] .      

## 2018-06-10 ENCOUNTER — Other Ambulatory Visit (INDEPENDENT_AMBULATORY_CARE_PROVIDER_SITE_OTHER): Payer: Commercial Managed Care - PPO

## 2018-06-10 DIAGNOSIS — Z0000 Encounter for general adult medical examination without abnormal findings: Secondary | ICD-10-CM

## 2018-06-10 DIAGNOSIS — R5383 Other fatigue: Secondary | ICD-10-CM | POA: Diagnosis not present

## 2018-06-11 ENCOUNTER — Telehealth: Payer: Self-pay | Admitting: Medical

## 2018-06-11 LAB — COMPREHENSIVE METABOLIC PANEL
ALT: 18 U/L (ref 0–53)
AST: 17 U/L (ref 0–37)
Albumin: 4.3 g/dL (ref 3.5–5.2)
Alkaline Phosphatase: 57 U/L (ref 39–117)
BILIRUBIN TOTAL: 0.7 mg/dL (ref 0.2–1.2)
BUN: 14 mg/dL (ref 6–23)
CO2: 24 meq/L (ref 19–32)
Calcium: 9.5 mg/dL (ref 8.4–10.5)
Chloride: 105 mEq/L (ref 96–112)
Creatinine, Ser: 1.16 mg/dL (ref 0.40–1.50)
GFR: 71.11 mL/min (ref >60.00)
GLUCOSE: 79 mg/dL (ref 70–99)
Potassium: 3.9 mEq/L (ref 3.5–5.1)
SODIUM: 139 meq/L (ref 135–145)
Total Protein: 6.9 g/dL (ref 6.0–8.3)

## 2018-06-11 LAB — LIPID PANEL
Cholesterol: 211 mg/dL — ABNORMAL HIGH (ref 0–200)
HDL: 38.6 mg/dL — ABNORMAL LOW (ref >39.00)
LDL CALC: 145 mg/dL — AB (ref 0–99)
NONHDL: 171.95
Total CHOL/HDL Ratio: 5
Triglycerides: 135 mg/dL (ref 0.0–149.0)
VLDL: 27 mg/dL (ref 0.0–40.0)

## 2018-06-11 LAB — CBC WITH DIFFERENTIAL/PLATELET
Basophils Absolute: 0.1 10*3/uL (ref 0.0–0.1)
Basophils Relative: 1 % (ref 0.0–3.0)
EOS PCT: 2.5 % (ref 0.0–5.0)
Eosinophils Absolute: 0.2 10*3/uL (ref 0.0–0.7)
HCT: 46.8 % (ref 39.0–52.0)
Hemoglobin: 16.2 g/dL (ref 13.0–17.0)
LYMPHS ABS: 2.7 10*3/uL (ref 0.7–4.0)
Lymphocytes Relative: 29 % (ref 12.0–46.0)
MCHC: 34.6 g/dL (ref 30.0–36.0)
MCV: 92.2 fl (ref 78.0–100.0)
MONO ABS: 0.7 10*3/uL (ref 0.1–1.0)
Monocytes Relative: 7.8 % (ref 3.0–12.0)
NEUTROS ABS: 5.6 10*3/uL (ref 1.4–7.7)
NEUTROS PCT: 59.7 % (ref 43.0–77.0)
PLATELETS: 234 10*3/uL (ref 150.0–400.0)
RBC: 5.08 Mil/uL (ref 4.22–5.81)
RDW: 13.2 % (ref 11.5–15.5)
WBC: 9.3 10*3/uL (ref 4.0–10.5)

## 2018-06-11 LAB — TSH: TSH: 2.09 u[IU]/mL (ref 0.35–4.50)

## 2018-06-12 NOTE — Telephone Encounter (Signed)
Opened to review 

## 2018-10-10 DIAGNOSIS — L918 Other hypertrophic disorders of the skin: Secondary | ICD-10-CM | POA: Diagnosis not present

## 2018-10-10 DIAGNOSIS — L814 Other melanin hyperpigmentation: Secondary | ICD-10-CM | POA: Diagnosis not present

## 2018-10-10 DIAGNOSIS — L821 Other seborrheic keratosis: Secondary | ICD-10-CM | POA: Diagnosis not present

## 2019-03-10 ENCOUNTER — Other Ambulatory Visit: Payer: Self-pay | Admitting: Orthopedic Surgery

## 2019-03-12 ENCOUNTER — Other Ambulatory Visit: Payer: Self-pay

## 2019-03-12 ENCOUNTER — Encounter (HOSPITAL_BASED_OUTPATIENT_CLINIC_OR_DEPARTMENT_OTHER): Payer: Self-pay | Admitting: *Deleted

## 2019-03-14 ENCOUNTER — Other Ambulatory Visit (HOSPITAL_COMMUNITY)
Admission: RE | Admit: 2019-03-14 | Discharge: 2019-03-14 | Disposition: A | Discharge status: Home / Self Care | Payer: Commercial Managed Care - PPO | Source: Ambulatory Visit | Attending: Orthopedic Surgery | Admitting: Orthopedic Surgery

## 2019-03-14 DIAGNOSIS — Z20828 Contact with and (suspected) exposure to other viral communicable diseases: Secondary | ICD-10-CM | POA: Insufficient documentation

## 2019-03-14 DIAGNOSIS — Z01812 Encounter for preprocedural laboratory examination: Secondary | ICD-10-CM | POA: Diagnosis present

## 2019-03-14 LAB — SARS CORONAVIRUS 2 (TAT 6-24 HRS): SARS Coronavirus 2: NEGATIVE

## 2019-03-14 NOTE — Progress Notes (Signed)

## 2019-03-18 ENCOUNTER — Other Ambulatory Visit: Payer: Self-pay

## 2019-03-18 ENCOUNTER — Ambulatory Visit (HOSPITAL_BASED_OUTPATIENT_CLINIC_OR_DEPARTMENT_OTHER)
Admission: RE | Admit: 2019-03-18 | Discharge: 2019-03-18 | Disposition: A | Discharge status: Home / Self Care | Payer: Commercial Managed Care - PPO | Attending: Orthopedic Surgery | Admitting: Orthopedic Surgery

## 2019-03-18 ENCOUNTER — Encounter (HOSPITAL_BASED_OUTPATIENT_CLINIC_OR_DEPARTMENT_OTHER): Payer: Self-pay

## 2019-03-18 ENCOUNTER — Encounter (HOSPITAL_BASED_OUTPATIENT_CLINIC_OR_DEPARTMENT_OTHER)
Admission: RE | Disposition: A | Discharge status: Home / Self Care | Payer: Self-pay | Source: Home / Self Care | Attending: Orthopedic Surgery

## 2019-03-18 ENCOUNTER — Ambulatory Visit (HOSPITAL_BASED_OUTPATIENT_CLINIC_OR_DEPARTMENT_OTHER): Payer: Commercial Managed Care - PPO | Admitting: Certified Registered"

## 2019-03-18 DIAGNOSIS — M7541 Impingement syndrome of right shoulder: Secondary | ICD-10-CM | POA: Diagnosis not present

## 2019-03-18 DIAGNOSIS — K219 Gastro-esophageal reflux disease without esophagitis: Secondary | ICD-10-CM | POA: Insufficient documentation

## 2019-03-18 DIAGNOSIS — X58XXXA Exposure to other specified factors, initial encounter: Secondary | ICD-10-CM | POA: Insufficient documentation

## 2019-03-18 DIAGNOSIS — S46011A Strain of muscle(s) and tendon(s) of the rotator cuff of right shoulder, initial encounter: Secondary | ICD-10-CM | POA: Insufficient documentation

## 2019-03-18 DIAGNOSIS — Z79899 Other long term (current) drug therapy: Secondary | ICD-10-CM | POA: Insufficient documentation

## 2019-03-18 DIAGNOSIS — Y990 Civilian activity done for income or pay: Secondary | ICD-10-CM | POA: Insufficient documentation

## 2019-03-18 DIAGNOSIS — Z841 Family history of disorders of kidney and ureter: Secondary | ICD-10-CM | POA: Diagnosis not present

## 2019-03-18 DIAGNOSIS — Z825 Family history of asthma and other chronic lower respiratory diseases: Secondary | ICD-10-CM | POA: Insufficient documentation

## 2019-03-18 DIAGNOSIS — M75101 Unspecified rotator cuff tear or rupture of right shoulder, not specified as traumatic: Secondary | ICD-10-CM | POA: Diagnosis present

## 2019-03-18 DIAGNOSIS — Z791 Long term (current) use of non-steroidal anti-inflammatories (NSAID): Secondary | ICD-10-CM | POA: Insufficient documentation

## 2019-03-18 DIAGNOSIS — Z885 Allergy status to narcotic agent status: Secondary | ICD-10-CM | POA: Insufficient documentation

## 2019-03-18 HISTORY — PX: SHOULDER ARTHROSCOPY WITH ROTATOR CUFF REPAIR: SHX5685

## 2019-03-18 HISTORY — PX: SHOULDER ARTHROSCOPY WITH SUBACROMIAL DECOMPRESSION: SHX5684

## 2019-03-18 SURGERY — ARTHROSCOPY, SHOULDER, WITH ROTATOR CUFF REPAIR
Anesthesia: General | Site: Shoulder | Laterality: Right

## 2019-03-18 MED ORDER — LIDOCAINE HCL (CARDIAC) PF 100 MG/5ML IV SOSY
PREFILLED_SYRINGE | INTRAVENOUS | Status: DC | PRN
  Administered 2019-03-18: 100 mg via INTRAVENOUS

## 2019-03-18 MED ORDER — CEFAZOLIN SODIUM-DEXTROSE 2-4 GM/100ML-% IV SOLN
2.0000 g | INTRAVENOUS | Status: AC
  Administered 2019-03-18: 3 g via INTRAVENOUS

## 2019-03-18 MED ORDER — BUPIVACAINE HCL (PF) 0.5 % IJ SOLN
INTRAMUSCULAR | Status: DC | PRN
  Administered 2019-03-18: 15 mL via PERINEURAL

## 2019-03-18 MED ORDER — EPHEDRINE SULFATE-NACL 50-0.9 MG/10ML-% IV SOSY
PREFILLED_SYRINGE | INTRAVENOUS | Status: DC | PRN
  Administered 2019-03-18 (×2): 5 mg via INTRAVENOUS

## 2019-03-18 MED ORDER — DEXAMETHASONE SODIUM PHOSPHATE 10 MG/ML IJ SOLN
INTRAMUSCULAR | Status: DC | PRN
  Administered 2019-03-18: 10 mg via INTRAVENOUS

## 2019-03-18 MED ORDER — TIZANIDINE HCL 4 MG PO TABS: 4.0000 mg | ORAL_TABLET | Freq: Three times a day (TID) | ORAL | 1 refills | Status: DC | PRN

## 2019-03-18 MED ORDER — MIDAZOLAM HCL 2 MG/2ML IJ SOLN
INTRAMUSCULAR | Status: AC
  Filled 2019-03-18: qty 2

## 2019-03-18 MED ORDER — SODIUM CHLORIDE 0.9 % IR SOLN
Status: DC | PRN
  Administered 2019-03-18: 6000 mL

## 2019-03-18 MED ORDER — MIDAZOLAM HCL 2 MG/2ML IJ SOLN
1.0000 mg | INTRAMUSCULAR | Status: DC | PRN
  Administered 2019-03-18: 2 mg via INTRAVENOUS

## 2019-03-18 MED ORDER — SUGAMMADEX SODIUM 200 MG/2ML IV SOLN
INTRAVENOUS | Status: DC | PRN
  Administered 2019-03-18: 260 mg via INTRAVENOUS

## 2019-03-18 MED ORDER — FENTANYL CITRATE (PF) 100 MCG/2ML IJ SOLN: 25.0000 ug | INTRAMUSCULAR | Status: DC | PRN

## 2019-03-18 MED ORDER — ROCURONIUM BROMIDE 10 MG/ML (PF) SYRINGE
PREFILLED_SYRINGE | INTRAVENOUS | Status: DC | PRN
  Administered 2019-03-18: 80 mg via INTRAVENOUS

## 2019-03-18 MED ORDER — CEFAZOLIN SODIUM-DEXTROSE 1-4 GM/50ML-% IV SOLN
INTRAVENOUS | Status: AC
  Filled 2019-03-18: qty 50

## 2019-03-18 MED ORDER — LACTATED RINGERS IV SOLN: INTRAVENOUS | Status: DC

## 2019-03-18 MED ORDER — FENTANYL CITRATE (PF) 100 MCG/2ML IJ SOLN
50.0000 ug | INTRAMUSCULAR | Status: DC | PRN
  Administered 2019-03-18: 100 ug via INTRAVENOUS

## 2019-03-18 MED ORDER — BUPIVACAINE LIPOSOME 1.3 % IJ SUSP
INTRAMUSCULAR | Status: DC | PRN
  Administered 2019-03-18: 10 mL via PERINEURAL

## 2019-03-18 MED ORDER — EPHEDRINE 5 MG/ML INJ
INTRAVENOUS | Status: AC
  Filled 2019-03-18: qty 10

## 2019-03-18 MED ORDER — CEFAZOLIN SODIUM-DEXTROSE 2-4 GM/100ML-% IV SOLN
INTRAVENOUS | Status: AC
  Filled 2019-03-18: qty 100

## 2019-03-18 MED ORDER — PHENYLEPHRINE 40 MCG/ML (10ML) SYRINGE FOR IV PUSH (FOR BLOOD PRESSURE SUPPORT)
PREFILLED_SYRINGE | INTRAVENOUS | Status: DC | PRN
  Administered 2019-03-18: 40 ug via INTRAVENOUS
  Administered 2019-03-18 (×2): 60 ug via INTRAVENOUS
  Administered 2019-03-18: 40 ug via INTRAVENOUS
  Administered 2019-03-18: 80 ug via INTRAVENOUS
  Administered 2019-03-18: 60 ug via INTRAVENOUS
  Administered 2019-03-18: 40 ug via INTRAVENOUS

## 2019-03-18 MED ORDER — ONDANSETRON HCL 4 MG PO TABS: 4.0000 mg | ORAL_TABLET | Freq: Three times a day (TID) | ORAL | 1 refills | Status: DC | PRN

## 2019-03-18 MED ORDER — LACTATED RINGERS IV SOLN
INTRAVENOUS | Status: DC
  Administered 2019-03-18 (×2): via INTRAVENOUS

## 2019-03-18 MED ORDER — CHLORHEXIDINE GLUCONATE 4 % EX LIQD: 60.0000 mL | Freq: Once | CUTANEOUS | Status: DC

## 2019-03-18 MED ORDER — PROPOFOL 10 MG/ML IV BOLUS
INTRAVENOUS | Status: DC | PRN
  Administered 2019-03-18: 200 mg via INTRAVENOUS

## 2019-03-18 MED ORDER — METOCLOPRAMIDE HCL 5 MG/ML IJ SOLN: 10.0000 mg | Freq: Once | INTRAMUSCULAR | Status: DC | PRN

## 2019-03-18 MED ORDER — MEPERIDINE HCL 25 MG/ML IJ SOLN: 6.2500 mg | INTRAMUSCULAR | Status: DC | PRN

## 2019-03-18 MED ORDER — FENTANYL CITRATE (PF) 100 MCG/2ML IJ SOLN
INTRAMUSCULAR | Status: AC
  Filled 2019-03-18: qty 2

## 2019-03-18 MED ORDER — PHENYLEPHRINE 40 MCG/ML (10ML) SYRINGE FOR IV PUSH (FOR BLOOD PRESSURE SUPPORT)
PREFILLED_SYRINGE | INTRAVENOUS | Status: AC
  Filled 2019-03-18: qty 10

## 2019-03-18 MED ORDER — ONDANSETRON HCL 4 MG/2ML IJ SOLN
INTRAMUSCULAR | Status: DC | PRN
  Administered 2019-03-18: 4 mg via INTRAVENOUS

## 2019-03-18 MED ORDER — SUGAMMADEX SODIUM 500 MG/5ML IV SOLN
INTRAVENOUS | Status: AC
  Filled 2019-03-18: qty 5

## 2019-03-18 MED ORDER — OXYCODONE-ACETAMINOPHEN 5-325 MG PO TABS: 1.0000 | ORAL_TABLET | ORAL | 0 refills | Status: DC | PRN

## 2019-03-18 SURGICAL SUPPLY — 86 items
ANCHOR PEEK 4.75X19.1 SWLK C (Anchor) ×15 IMPLANT
BENZOIN TINCTURE PRP APPL 2/3 (GAUZE/BANDAGES/DRESSINGS) IMPLANT
BLADE CLIPPER SURG (BLADE) IMPLANT
BLADE SHAVER BONE 5.0MM X 13CM (MISCELLANEOUS)
BLADE SHAVER BONE 5.0X13 (MISCELLANEOUS) IMPLANT
BLADE SURG 15 STRL LF DISP TIS (BLADE) IMPLANT
BLADE SURG 15 STRL SS (BLADE)
BURR OVAL 8 FLU 4.0MM X 13CM (MISCELLANEOUS) ×1
BURR OVAL 8 FLU 4.0X13 (MISCELLANEOUS) ×2 IMPLANT
CANNULA 5.75X7 CRYSTAL CLEAR (CANNULA) ×3 IMPLANT
CANNULA 5.75X71 LONG (CANNULA) IMPLANT
CANNULA TWIST IN 8.25X7CM (CANNULA) IMPLANT
CHLORAPREP W/TINT 26 (MISCELLANEOUS) ×3 IMPLANT
CLOSURE WOUND 1/2 X4 (GAUZE/BANDAGES/DRESSINGS)
COVER WAND RF STERILE (DRAPES) IMPLANT
CUTTER BONE 4.0MM X 13CM (MISCELLANEOUS) ×3 IMPLANT
DECANTER SPIKE VIAL GLASS SM (MISCELLANEOUS) IMPLANT
DERMABOND ADVANCED (GAUZE/BANDAGES/DRESSINGS)
DERMABOND ADVANCED .7 DNX12 (GAUZE/BANDAGES/DRESSINGS) IMPLANT
DRAPE HALF SHEET 70X43 (DRAPES) IMPLANT
DRAPE IMP U-DRAPE 54X76 (DRAPES) ×3 IMPLANT
DRAPE INCISE IOBAN 66X45 STRL (DRAPES) ×3 IMPLANT
DRAPE SPLIT 6X30 W/TAPE (DRAPES) ×6 IMPLANT
DRAPE STERI 35X30 U-POUCH (DRAPES) ×3 IMPLANT
DRAPE SURG 17X23 STRL (DRAPES) ×3 IMPLANT
DRAPE U-SHAPE 47X51 STRL (DRAPES) ×3 IMPLANT
DRSG PAD ABDOMINAL 8X10 ST (GAUZE/BANDAGES/DRESSINGS) ×3 IMPLANT
ELECT REM PT RETURN 9FT ADLT (ELECTROSURGICAL)
ELECTRODE REM PT RTRN 9FT ADLT (ELECTROSURGICAL) IMPLANT
GAUZE 4X4 16PLY RFD (DISPOSABLE) IMPLANT
GAUZE SPONGE 4X4 12PLY STRL (GAUZE/BANDAGES/DRESSINGS) ×3 IMPLANT
GAUZE XEROFORM 1X8 LF (GAUZE/BANDAGES/DRESSINGS) ×3 IMPLANT
GLOVE BIO SURGEON STRL SZ7 (GLOVE) ×3 IMPLANT
GLOVE BIO SURGEON STRL SZ7.5 (GLOVE) ×3 IMPLANT
GLOVE BIOGEL PI IND STRL 7.0 (GLOVE) ×2 IMPLANT
GLOVE BIOGEL PI IND STRL 8 (GLOVE) ×1 IMPLANT
GLOVE BIOGEL PI INDICATOR 7.0 (GLOVE) ×4
GLOVE BIOGEL PI INDICATOR 8 (GLOVE) ×2
GLOVE ECLIPSE 6.5 STRL STRAW (GLOVE) ×3 IMPLANT
GOWN STRL REUS W/ TWL LRG LVL3 (GOWN DISPOSABLE) ×2 IMPLANT
GOWN STRL REUS W/ TWL XL LVL3 (GOWN DISPOSABLE) ×1 IMPLANT
GOWN STRL REUS W/TWL LRG LVL3 (GOWN DISPOSABLE) ×4
GOWN STRL REUS W/TWL XL LVL3 (GOWN DISPOSABLE) ×2
LASSO CRESCENT QUICKPASS (SUTURE) IMPLANT
MANIFOLD NEPTUNE II (INSTRUMENTS) ×3 IMPLANT
NDL SUT 6 .5 CRC .975X.05 MAYO (NEEDLE) IMPLANT
NEEDLE 1/2 CIR CATGUT .05X1.09 (NEEDLE) IMPLANT
NEEDLE MAYO TAPER (NEEDLE)
NEEDLE SCORPION MULTI FIRE (NEEDLE) ×3 IMPLANT
NS IRRIG 1000ML POUR BTL (IV SOLUTION) IMPLANT
PACK ARTHROSCOPY DSU (CUSTOM PROCEDURE TRAY) ×3 IMPLANT
PACK BASIN DAY SURGERY FS (CUSTOM PROCEDURE TRAY) ×3 IMPLANT
PENCIL BUTTON HOLSTER BLD 10FT (ELECTRODE) IMPLANT
PORT APPOLLO RF 90DEGREE MULTI (SURGICAL WAND) IMPLANT
PROBE BIPOLAR ATHRO 135MM 90D (MISCELLANEOUS) ×6 IMPLANT
RESTRAINT HEAD UNIVERSAL NS (MISCELLANEOUS) ×3 IMPLANT
SLEEVE SCD COMPRESS KNEE MED (MISCELLANEOUS) ×3 IMPLANT
SLING ARM FOAM STRAP LRG (SOFTGOODS) IMPLANT
SLING ARM IMMOBILIZER XL (CAST SUPPLIES) ×3 IMPLANT
SPONGE LAP 4X18 RFD (DISPOSABLE) IMPLANT
STRIP CLOSURE SKIN 1/2X4 (GAUZE/BANDAGES/DRESSINGS) IMPLANT
SUCTION FRAZIER HANDLE 10FR (MISCELLANEOUS)
SUCTION TUBE FRAZIER 10FR DISP (MISCELLANEOUS) IMPLANT
SUPPORT WRAP ARM LG (MISCELLANEOUS) ×3 IMPLANT
SUT BONE WAX W31G (SUTURE) IMPLANT
SUT ETHILON 3 0 PS 1 (SUTURE) ×3 IMPLANT
SUT ETHILON 4 0 PS 2 18 (SUTURE) IMPLANT
SUT FIBERWIRE #2 38 T-5 BLUE (SUTURE)
SUT MNCRL AB 3-0 PS2 18 (SUTURE) IMPLANT
SUT MNCRL AB 4-0 PS2 18 (SUTURE) IMPLANT
SUT PDS AB 0 CT 36 (SUTURE) IMPLANT
SUT PROLENE 3 0 PS 2 (SUTURE) IMPLANT
SUT TIGER TAPE 7 IN WHITE (SUTURE) ×3 IMPLANT
SUT VIC AB 0 CT1 27 (SUTURE)
SUT VIC AB 0 CT1 27XBRD ANBCTR (SUTURE) IMPLANT
SUT VIC AB 2-0 SH 27 (SUTURE)
SUT VIC AB 2-0 SH 27XBRD (SUTURE) IMPLANT
SUTURE FIBERWR #2 38 T-5 BLUE (SUTURE) IMPLANT
SYR BULB 3OZ (MISCELLANEOUS) IMPLANT
TAPE FIBER 2MM 7IN #2 BLUE (SUTURE) ×6 IMPLANT
TOWEL GREEN STERILE FF (TOWEL DISPOSABLE) ×3 IMPLANT
TUBE CONNECTING 20'X1/4 (TUBING) ×1
TUBE CONNECTING 20X1/4 (TUBING) ×2 IMPLANT
TUBING ARTHROSCOPY IRRIG 16FT (MISCELLANEOUS) ×3 IMPLANT
WATER STERILE IRR 1000ML POUR (IV SOLUTION) ×3 IMPLANT
YANKAUER SUCT BULB TIP NO VENT (SUCTIONS) IMPLANT

## 2019-03-18 NOTE — H&P (Signed)
Philip Pearson is an 48 y.o. male.   Chief Complaint: R shoulder pain and dysfunction. HPI: s/p R shoulder injury at work in April with large R rotator cuff tear, failed conservative measures. Past Medical History:  Diagnosis Date  . GERD (gastroesophageal reflux disease)    given dx before pancreatitis.  . Medical history non-contributory   . Pancreatitis    No alcohol use and no gallbladder disease.    Past Surgical History:  Procedure Laterality Date  . NO PAST SURGERIES      Family History  Problem Relation Age of Onset  . Emphysema Father   . Kidney disease Father   . Colon cancer Neg Hx   . Colon polyps Neg Hx   . Diabetes Neg Hx   . Esophageal cancer Neg Hx   . Heart disease Neg Hx   . Gallbladder disease Neg Hx    Social History:  reports that he has never smoked. He has never used smokeless tobacco. He reports that he does not drink alcohol or use drugs.  Allergies:  Allergies  Allergen Reactions  . Codeine Nausea And Vomiting    Medications Prior to Admission  Medication Sig Dispense Refill  . acetaminophen (TYLENOL) 500 MG tablet Take 500 mg by mouth every 6 (six) hours as needed.    Marland Kitchen ibuprofen (ADVIL) 400 MG tablet Take 400 mg by mouth every 6 (six) hours as needed.      No results found for this or any previous visit (from the past 48 hour(s)). No results found.  ROS  Blood pressure (!) 123/92, pulse 66, temperature 97.7 F (36.5 C), temperature source Oral, resp. rate 13, height 6' (1.829 m), weight 127.2 kg, SpO2 94 %. Physical Exam   Assessment/Plan s/p R shoulder injury at work in April with large R rotator cuff tear, failed conservative measures. Plan R arth RCR, SAD. Risks / benefits of surgery discussed Consent on chart  NPO for OR Preop antibiotics   Isabella Stalling, MD 03/18/2019, 12:41 PM

## 2019-03-18 NOTE — Anesthesia Procedure Notes (Signed)
Anesthesia Regional Block: Supraclavicular block   Pre-Anesthetic Checklist: ,, timeout performed, Correct Patient, Correct Site, Correct Laterality, Correct Procedure, Correct Position, site marked, Risks and benefits discussed,  Surgical consent,  Pre-op evaluation,  At surgeon's request and post-op pain management  Laterality: Right and Upper  Prep: Maximum Sterile Barrier Precautions used, chloraprep       Needles:  Injection technique: Single-shot  Needle Type: Echogenic Stimulator Needle     Needle Length: 10cm      Additional Needles:   Procedures:,,,, ultrasound used (permanent image in chart),,,,  Narrative:  Start time: 03/18/2019 11:30 AM End time: 03/18/2019 11:40 AM Injection made incrementally with aspirations every 5 mL.  Performed by: Personally  Anesthesiologist: Montez Hageman, MD  Additional Notes: Risks, benefits and alternative to block explained extensively.  Patient tolerated procedure well, without complications.

## 2019-03-18 NOTE — Anesthesia Postprocedure Evaluation (Signed)
Anesthesia Post Note  Patient: Praise Dolecki  Procedure(s) Performed: SHOULDER ARTHROSCOPY WITH ROTATOR CUFF REPAIR (Right Shoulder) SHOULDER ARTHROSCOPY WITH SUBACROMIAL DECOMPRESSION (Right Shoulder)     Patient location during evaluation: PACU Anesthesia Type: General Level of consciousness: awake and alert Pain management: pain level controlled Vital Signs Assessment: post-procedure vital signs reviewed and stable Respiratory status: spontaneous breathing, nonlabored ventilation, respiratory function stable and patient connected to nasal cannula oxygen Cardiovascular status: blood pressure returned to baseline and stable Postop Assessment: no apparent nausea or vomiting Anesthetic complications: no    Last Vitals:  Vitals:   03/18/19 1500 03/18/19 1510  BP: 134/89 128/88  Pulse: 84 82  Resp: 17 16  Temp:  36.7 C  SpO2: 94% 94%    Last Pain:  Vitals:   03/18/19 1530  TempSrc:   PainSc: 2                  Montez Hageman

## 2019-03-18 NOTE — Discharge Instructions (Signed)
Discharge Instructions after Arthroscopic Shoulder Repair   A sling has been provided for you. Remain in your sling at all times. This includes sleeping in your sling.  Use ice on the shoulder intermittently over the first 48 hours after surgery.  Pain medicine has been prescribed for you.  Use your medicine liberally over the first 48 hours, and then you can begin to taper your use. You may take Extra Strength Tylenol or Tylenol only in place of the pain pills. DO NOT take ANY nonsteroidal anti-inflammatory pain medications: Advil, Motrin, Ibuprofen, Aleve, Naproxen, or Narprosyn.  You may remove your dressing after two days. If the incision sites are still moist, place a Band-Aid over the moist site(s). Change Band-Aids daily until dry.  You may shower 5 days after surgery. The incisions CANNOT get wet prior to 5 days. Simply allow the water to wash over the site and then pat dry. Do not rub the incisions. Make sure your axilla (armpit) is completely dry after showering.  Take one aspirin a day for 2 weeks after surgery, unless you have an aspirin sensitivity/ allergy or asthma.   Please call [phone] during normal business hours or [phone] after hours for any problems. Including the following:  - excessive redness of the incisions - drainage for more than 4 days - fever of more than 101.5 F  *Please note that pain medications will not be refilled after hours or on weekends.   Post Anesthesia Home Care Instructions  Activity: Get plenty of rest for the remainder of the day. A responsible individual must stay with you for 24 hours following the procedure.  For the next 24 hours, DO NOT: -Drive a car -Operate machinery -Drink alcoholic beverages -Take any medication unless instructed by your physician -Make any legal decisions or sign important papers.  Meals: Start with liquid foods such as gelatin or soup. Progress to regular foods as tolerated. Avoid greasy, spicy, heavy  foods. If nausea and/or vomiting occur, drink only clear liquids until the nausea and/or vomiting subsides. Call your physician if vomiting continues.  Special Instructions/Symptoms: Your throat may feel dry or sore from the anesthesia or the breathing tube placed in your throat during surgery. If this causes discomfort, gargle with warm salt water. The discomfort should disappear within 24 hours.  If you had a scopolamine patch placed behind your ear for the management of post- operative nausea and/or vomiting:  1. The medication in the patch is effective for 72 hours, after which it should be removed.  Wrap patch in a tissue and discard in the trash. Wash hands thoroughly with soap and water. 2. You may remove the patch earlier than 72 hours if you experience unpleasant side effects which may include dry mouth, dizziness or visual disturbances. 3. Avoid touching the patch. Wash your hands with soap and water after contact with the patch.     Regional Anesthesia Blocks  1. Numbness or the inability to move the "blocked" extremity may last from 3-48 hours after placement. The length of time depends on the medication injected and your individual response to the medication. If the numbness is not going away after 48 hours, call your surgeon.  2. The extremity that is blocked will need to be protected until the numbness is gone and the  Strength has returned. Because you cannot feel it, you will need to take extra care to avoid injury. Because it may be weak, you may have difficulty moving it or using it. You may   not know what position it is in without looking at it while the block is in effect.  3. For blocks in the legs and feet, returning to weight bearing and walking needs to be done carefully. You will need to wait until the numbness is entirely gone and the strength has returned. You should be able to move your leg and foot normally before you try and bear weight or walk. You will need someone  to be with you when you first try to ensure you do not fall and possibly risk injury.  4. Bruising and tenderness at the needle site are common side effects and will resolve in a few days.  5. Persistent numbness or new problems with movement should be communicated to the surgeon or the [facility] ([phone])/ [facility] ([phone]).  Information for Discharge Teaching: EXPAREL (bupivacaine liposome injectable suspension)   Your surgeon or anesthesiologist gave you EXPAREL(bupivacaine) to help control your pain after surgery.   EXPAREL is a local anesthetic that provides pain relief by numbing the tissue around the surgical site.  EXPAREL is designed to release pain medication over time and can control pain for up to 72 hours.  Depending on how you respond to EXPAREL, you may require less pain medication during your recovery.  Possible side effects:  Temporary loss of sensation or ability to move in the area where bupivacaine was injected.  Nausea, vomiting, constipation  Rarely, numbness and tingling in your mouth or lips, lightheadedness, or anxiety may occur.  Call your doctor right away if you think you may be experiencing any of these sensations, or if you have other questions regarding possible side effects.  Follow all other discharge instructions given to you by your surgeon or nurse. Eat a healthy diet and drink plenty of water or other fluids.  If you return to the hospital for any reason within 96 hours following the administration of EXPAREL, it is important for health care providers to know that you have received this anesthetic. A teal colored band has been placed on your arm with the date, time and amount of EXPAREL you have received in order to alert and inform your health care providers. Please leave this armband in place for the full 96 hours following administration, and then you may remove the band.    

## 2019-03-18 NOTE — Op Note (Signed)
Procedure(s): SHOULDER ARTHROSCOPY WITH ROTATOR CUFF REPAIR SHOULDER ARTHROSCOPY WITH SUBACROMIAL DECOMPRESSION Procedure Note  Philip Pearson male 48 y.o. 03/18/2019   Preoperative diagnosis: #1 right shoulder large rotator cuff tear #2 right shoulder impingement with unfavorable acromial anatomy  Postoperative diagnosis: Same  Procedure(s) and Anesthesia Type:    * SHOULDER ARTHROSCOPY WITH ROTATOR CUFF REPAIR - General    * SHOULDER ARTHROSCOPY WITH SUBACROMIAL DECOMPRESSION - General  Surgeon(s) and Role:    Jones Broom* Jossue Rubenstein, MD - Primary     Surgeon: Berline LopesJustin W Raistlin Gum   Assistants: Damita Lackanielle Lalibert PA-C (Danielle was present and scrubbed throughout the procedure and was essential in positioning, assisting with the camera and instrumentation,, and closure)  Anesthesia: General endotracheal anesthesia with preoperative interscalene block given by the attending anesthesiologist    Procedure Detail  SHOULDER ARTHROSCOPY WITH ROTATOR CUFF REPAIR, SHOULDER ARTHROSCOPY WITH SUBACROMIAL DECOMPRESSION  Estimated Blood Loss: Min         Drains: none  Blood Given: none         Specimens: none        Complications:  * No complications entered in OR log *         Disposition: PACU - hemodynamically stable.         Condition: stable    Procedure:   INDICATIONS FOR SURGERY: The patient is 48 y.o. male who injured his right upper extremity at work.  He went through rehabilitation but continued to have pain and weakness and ultimately was found to have a large rotator cuff tear on the right shoulder.  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  OPERATIVE FINDINGS: Examination under anesthesia: No stiffness or instability.   DESCRIPTION OF PROCEDURE: The patient was identified in preoperative  holding area where I personally marked the operative site after  verifying site,  side, and procedure with the patient. An interscalene block was given by the attending anesthesiologist the holding area.  The patient was taken back to the operating room where general anesthesia was induced without complication and was placed in the beach-chair position with the back  elevated about 60 degrees and all extremities and head and neck carefully padded and  positioned.   The right upper extremity was then prepped and  draped in a standard sterile fashion. The appropriate time-out  procedure was carried out. The patient did receive IV antibiotics  within 30 minutes of incision.   A small posterior portal incision was made and the arthroscope was introduced into the joint. An anterior portal was then established above the subscapularis using needle localization. Small cannula was placed anteriorly. Diagnostic arthroscopy was then carried out.  The subscapularis was noted to have some longitudinal split tearing but no detachment laterally.  This was lightly debrided.  The biceps tendon and superior labrum were intact with only mild fraying.  Glenohumeral joint surfaces were intact without significant chondromalacia.  The remainder of the labrum was intact  The rotator cuff including supraspinatus and infraspinatus was noted to be completely torn but only minimally retracted.  The arthroscope was then introduced into the subacromial space a standard lateral portal was established with needle localization. The shaver was used through the lateral portal to perform extensive bursectomy.   The bursal surface of the rotator cuff was examined.  The tendon edge was healthy and stout.  The tuberosity was completely bare throughout the entire supraspinatus and infraspinatus footprint.  There was not significant retraction and mobilization was not an issue.  The tendon was easily brought over the tuberosity.  Therefore the repair was then carried out by first placing 3 4.75 peek swivel lock anchors  preloaded with fiber tape and Tiger tape just off the articular margin evenly spaced anterior to posterior.  The 6 suture strands were then passed evenly throughout the tear and brought over in a crossing suture bridge pattern to 2 additional swivel lock anchors laterally bringing the tendon down nicely over the prepared tuberosity without any undue tension.  The repair was felt to be complete and watertight.  The coracoacromial ligament was taken down off the anterior acromion with the ArthroCare exposing a moderate hooked anterior acromial spur. A high-speed bur was then used through the lateral portal to take down the anterior acromial spur from lateral to medial in a standard acromioplasty.  The acromioplasty was also viewed from the lateral portal and the bur was used as necessary to ensure that the acromion was completely flat from posterior to anterior.  The arthroscopic equipment was removed from the joint and the portals were closed with 3-0 nylon in an interrupted fashion. Sterile dressings were then applied including Xeroform 4 x 4's ABDs and tape. The patient was then allowed to awaken from general anesthesia, placed in a sling, transferred to the stretcher and taken to the recovery room in stable condition.   POSTOPERATIVE PLAN: The patient will be discharged home today and will followup in one week for suture removal and wound check.  He will follow the standard cuff protocol.

## 2019-03-18 NOTE — Anesthesia Procedure Notes (Signed)
Procedure Name: Intubation Date/Time: 03/18/2019 1:03 PM Performed by: Raenette Rover, CRNA Pre-anesthesia Checklist: Emergency Drugs available, Patient identified, Suction available and Patient being monitored Patient Re-evaluated:Patient Re-evaluated prior to induction Oxygen Delivery Method: Circle system utilized Preoxygenation: Pre-oxygenation with 100% oxygen Induction Type: IV induction Ventilation: Two handed mask ventilation required and Oral airway inserted - appropriate to patient size Laryngoscope Size: Sabra Heck and 3 Grade View: Grade I Tube type: Oral Tube size: 8.0 mm Number of attempts: 1 Airway Equipment and Method: Stylet Placement Confirmation: ETT inserted through vocal cords under direct vision,  positive ETCO2 and breath sounds checked- equal and bilateral Secured at: 22 cm Tube secured with: Tape Dental Injury: Teeth and Oropharynx as per pre-operative assessment

## 2019-03-18 NOTE — Anesthesia Preprocedure Evaluation (Signed)
Anesthesia Evaluation  Patient identified by MRN, date of birth, ID band Patient awake    Reviewed: Allergy & Precautions, NPO status , Patient's Chart, lab work & pertinent test results  Airway Mallampati: II  TM Distance: >3 FB Neck ROM: Full    Dental no notable dental hx.    Pulmonary neg pulmonary ROS,    Pulmonary exam normal breath sounds clear to auscultation       Cardiovascular negative cardio ROS Normal cardiovascular exam Rhythm:Regular Rate:Normal     Neuro/Psych negative neurological ROS  negative psych ROS   GI/Hepatic negative GI ROS, Neg liver ROS,   Endo/Other  negative endocrine ROS  Renal/GU negative Renal ROS  negative genitourinary   Musculoskeletal negative musculoskeletal ROS (+)   Abdominal   Peds negative pediatric ROS (+)  Hematology negative hematology ROS (+)   Anesthesia Other Findings   Reproductive/Obstetrics negative OB ROS                             Anesthesia Physical Anesthesia Plan  ASA: II  Anesthesia Plan: General   Post-op Pain Management:  Regional for Post-op pain   Induction: Intravenous  PONV Risk Score and Plan: 2 and Ondansetron, Dexamethasone and Treatment may vary due to age or medical condition  Airway Management Planned: Oral ETT  Additional Equipment:   Intra-op Plan:   Post-operative Plan: Extubation in OR  Informed Consent: I have reviewed the patients History and Physical, chart, labs and discussed the procedure including the risks, benefits and alternatives for the proposed anesthesia with the patient or authorized representative who has indicated his/her understanding and acceptance.     Dental advisory given  Plan Discussed with: CRNA  Anesthesia Plan Comments:         Anesthesia Quick Evaluation

## 2019-03-18 NOTE — Progress Notes (Signed)
AssistedDr. Carignan with right, ultrasound guided, interscalene  block. Side rails up, monitors on throughout procedure. See vital signs in flow sheet. Tolerated Procedure well.  

## 2019-03-18 NOTE — Transfer of Care (Addendum)
Immediate Anesthesia Transfer of Care Note  Patient: Cloy Cozzens  Procedure(s) Performed: SHOULDER ARTHROSCOPY WITH ROTATOR CUFF REPAIR (Right Shoulder) SHOULDER ARTHROSCOPY WITH SUBACROMIAL DECOMPRESSION (Right Shoulder)  Patient Location: PACU  Anesthesia Type:GA combined with regional for post-op pain  Level of Consciousness: awake, alert , oriented, drowsy and patient cooperative  Airway & Oxygen Therapy: Patient Spontanous Breathing and Patient connected to nasal cannula oxygen  Post-op Assessment: Report given to RN and Post -op Vital signs reviewed and stable  Post vital signs: Reviewed and stable--pt ventilating, resp is not zero  Last Vitals:  Vitals Value Taken Time  BP    Temp    Pulse 83 03/18/19 1430  Resp 0 03/18/19 1430  SpO2 96 % 03/18/19 1430  Vitals shown include unvalidated device data.  Last Pain:  Vitals:   03/18/19 1035  TempSrc: Oral  PainSc: 1       Patients Stated Pain Goal: 5 (67/12/45 8099)  Complications: No apparent anesthesia complications

## 2019-03-19 ENCOUNTER — Encounter (HOSPITAL_BASED_OUTPATIENT_CLINIC_OR_DEPARTMENT_OTHER): Payer: Self-pay | Admitting: Orthopedic Surgery

## 2019-05-27 ENCOUNTER — Telehealth: Payer: Self-pay | Admitting: Medical

## 2019-05-27 NOTE — Telephone Encounter (Signed)
Pt needs some medical records sent. Will you form from [facility].

## 2019-06-16 ENCOUNTER — Encounter: Payer: Commercial Managed Care - PPO | Admitting: Medical

## 2019-06-20 ENCOUNTER — Other Ambulatory Visit: Payer: Self-pay

## 2019-06-23 ENCOUNTER — Encounter: Payer: Commercial Managed Care - PPO | Admitting: Medical

## 2019-06-24 ENCOUNTER — Other Ambulatory Visit: Payer: Self-pay

## 2019-06-25 ENCOUNTER — Encounter: Payer: Self-pay | Admitting: Medical

## 2019-06-25 ENCOUNTER — Other Ambulatory Visit: Payer: Self-pay

## 2019-06-25 ENCOUNTER — Ambulatory Visit (INDEPENDENT_AMBULATORY_CARE_PROVIDER_SITE_OTHER): Payer: Commercial Managed Care - PPO | Admitting: Medical

## 2019-06-25 VITALS — BP 140/90 | HR 79 | Temp 97.7°F | Resp 12 | Ht 72.0 in | Wt 283.8 lb

## 2019-06-25 DIAGNOSIS — Z125 Encounter for screening for malignant neoplasm of prostate: Secondary | ICD-10-CM | POA: Diagnosis not present

## 2019-06-25 DIAGNOSIS — Z1211 Encounter for screening for malignant neoplasm of colon: Secondary | ICD-10-CM | POA: Diagnosis not present

## 2019-06-25 DIAGNOSIS — Z0000 Encounter for general adult medical examination without abnormal findings: Secondary | ICD-10-CM | POA: Diagnosis not present

## 2019-06-25 NOTE — Progress Notes (Signed)
Subjective:    Patient ID: Philip Pearson, male    DOB: [birth year], 49 y.o.   MRN: [id number]  HPI  Pt in for wellness exam. Pt is fasting.  Pt will get flu vaccine.  Pt is due for colonoscopy. He wants to get done in March, 2020.  Update me he had rotator cuff surgery and doing well.  Pt has not been exercising. Moderate healthy diet.    Review of Systems  Constitutional: Negative for chills, fatigue and fever.  HENT: Negative for congestion, ear discharge, facial swelling, sinus pressure and sinus pain.   Respiratory: Negative for cough, chest tightness, shortness of breath and wheezing.        Pt stats his girlfriend thinks he has sleep apnea. This is based on fact that he seems to doze off randomly during the day. But this occurred when on pain meds after rotator cuff surgery. Pt does not live with girlfriend has not heard him snore.  Pt has gained some weight since surgery.   He wants referral for sleep apnea evaluation but in spring.   Cardiovascular: Negative for chest pain and palpitations.  Gastrointestinal: Negative for abdominal pain.  Genitourinary: Negative for dysuria and flank pain.  Musculoskeletal: Negative for back pain and myalgias.  Skin: Negative for rash.  Neurological: Negative for dizziness, speech difficulty, weakness, numbness and headaches.  Hematological: Negative for adenopathy. Does not bruise/bleed easily.  Psychiatric/Behavioral: Negative for behavioral problems and decreased concentration.    Past Medical History:  Diagnosis Date  . GERD (gastroesophageal reflux disease)    given dx before pancreatitis.  . Medical history non-contributory   . Pancreatitis    No alcohol use and no gallbladder disease.     Social History   Socioeconomic History  . Marital status: Divorced    Spouse name: Not on file  . Number of children: 0  . Years of education: Not on file  . Highest education level: Not on file  Occupational History  .  Occupation: [employer] fast streamer  Social Needs  . Financial resource strain: Not on file  . Food insecurity    Worry: Not on file    Inability: Not on file  . Transportation needs    Medical: Not on file    Non-medical: Not on file  Tobacco Use  . Smoking status: Never Smoker  . Smokeless tobacco: Never Used  Substance and Sexual Activity  . Alcohol use: No    Alcohol/week: 0.0 standard drinks  . Drug use: No  . Sexual activity: Yes  Lifestyle  . Physical activity    Days per week: Not on file    Minutes per session: Not on file  . Stress: Not on file  Relationships  . Social Musician on phone: Not on file    Gets together: Not on file    Attends religious service: Not on file    Active member of club or organization: Not on file    Attends meetings of clubs or organizations: Not on file    Relationship status: Not on file  . Intimate partner violence    Fear of current or ex partner: Not on file    Emotionally abused: Not on file    Physically abused: Not on file    Forced sexual activity: Not on file  Other Topics Concern  . Not on file  Social History Narrative  . Not on file    Past Surgical History:  Procedure Laterality Date  .  NO PAST SURGERIES    . SHOULDER ARTHROSCOPY WITH ROTATOR CUFF REPAIR Right 03/18/2019   Procedure: SHOULDER ARTHROSCOPY WITH ROTATOR CUFF REPAIR;  Surgeon: Tania Ade, MD;  Location: [facility];  Service: Orthopedics;  Laterality: Right;  . SHOULDER ARTHROSCOPY WITH SUBACROMIAL DECOMPRESSION Right 03/18/2019   Procedure: SHOULDER ARTHROSCOPY WITH SUBACROMIAL DECOMPRESSION;  Surgeon: Tania Ade, MD;  Location: [facility];  Service: Orthopedics;  Laterality: Right;    Family History  Problem Relation Age of Onset  . Emphysema Father   . Kidney disease Father   . Colon cancer Neg Hx   . Colon polyps Neg Hx   . Diabetes Neg Hx   . Esophageal cancer Neg Hx   . Heart disease Neg Hx   .  Gallbladder disease Neg Hx     Allergies  Allergen Reactions  . Codeine Nausea And Vomiting    No current outpatient medications on file prior to visit.   No current facility-administered medications on file prior to visit.     BP 140/90 (BP Location: Left Arm, Cuff Size: Large)   Pulse 79   Temp 97.7 F (36.5 C) (Temporal)   Resp 12   Ht 6' (1.829 m)   Wt 283 lb 12.8 oz (128.7 kg)   SpO2 98%   BMI 38.49 kg/m       Objective:   Physical Exam  General Mental Status- Alert. General Appearance- Not in acute distress.   Skin General: Color- Normal Color. Moisture- Normal Moisture.  Rt side back. Lower back. Look like seborrheic keratosis(seen by derm feb 2020 per pt. No treatment done) Told to watch area.   Neck Carotid Arteries- Normal color. Moisture- Normal Moisture. No carotid bruits. No JVD.  Chest and Lung Exam Auscultation: Breath Sounds:-Normal.  Cardiovascular Auscultation:Rythm- Regular. Murmurs & Other Heart Sounds:Auscultation of the heart reveals- No Murmurs.  Abdomen Inspection:-Inspeection Normal. Palpation/Percussion:Note:No mass. Palpation and Percussion of the abdomen reveal- Non Tender, Non Distended + BS, no rebound or guarding.   Neurologic Cranial Nerve exam:- CN III-XII intact(No nystagmus), symmetric smile. Strength:- 5/5 equal and symmetric strength both upper and lower extremities.      Assessment & Plan:  For you wellness exam today I have ordered cbc, cmp, lipid panel and psa.  Flu vaccine today.  Recommend exercise and healthy diet.  We will let you know lab results as they come in.  Referral to GI for screening colonoscopy. Explain your preferred date.  You would likely benefit from sleep apnea evaluation as common with higher bmi. You decline referral now. So recommend referral when you are ready. Let me know as you mentioned possible March date. Also get girlfriend to monitor your sleep to get idea if you snore.   Follow up date appointment will be determined after lab review.  Mackie Pai, PA-C

## 2019-06-25 NOTE — Patient Instructions (Addendum)
For you wellness exam today I have ordered cbc, cmp, lipid panel and psa.  Flu vaccine today.  Recommend exercise and healthy diet.  We will let you know lab results as they come in.  Referral to GI for screening colonoscopy. Explain your preferred date.  You would likely benefit from sleep apnea evaluation as common with higher bmi. You decline referral now. So recommend referral when you are ready. Let me know as you mentioned possible March date. Also get girlfriend to monitor your sleep to get idea if you snore.  Follow up date appointment will be determined after lab review.    Preventive Care 30-50 Years Old, Male Preventive care refers to lifestyle choices and visits with your health care provider that can promote health and wellness. This includes:  A yearly physical exam. This is also called an annual well check.  Regular dental and eye exams.  Immunizations.  Screening for certain conditions.  Healthy lifestyle choices, such as eating a healthy diet, getting regular exercise, not using drugs or products that contain nicotine and tobacco, and limiting alcohol use. What can I expect for my preventive care visit? Physical exam Your health care provider will check:  Height and weight. These may be used to calculate body mass index (BMI), which is a measurement that tells if you are at a healthy weight.  Heart rate and blood pressure.  Your skin for abnormal spots. Counseling Your health care provider may ask you questions about:  Alcohol, tobacco, and drug use.  Emotional well-being.  Home and relationship well-being.  Sexual activity.  Eating habits.  Work and work Statistician. What immunizations do I need?  Influenza (flu) vaccine  This is recommended every year. Tetanus, diphtheria, and pertussis (Tdap) vaccine  You may need a Td booster every 10 years. Varicella (chickenpox) vaccine  You may need this vaccine if you have not already been  vaccinated. Zoster (shingles) vaccine  You may need this after age 20. Measles, mumps, and rubella (MMR) vaccine  You may need at least one dose of MMR if you were born in 1957 or later. You may also need a second dose. Pneumococcal conjugate (PCV13) vaccine  You may need this if you have certain conditions and were not previously vaccinated. Pneumococcal polysaccharide (PPSV23) vaccine  You may need one or two doses if you smoke cigarettes or if you have certain conditions. Meningococcal conjugate (MenACWY) vaccine  You may need this if you have certain conditions. Hepatitis A vaccine  You may need this if you have certain conditions or if you travel or work in places where you may be exposed to hepatitis A. Hepatitis B vaccine  You may need this if you have certain conditions or if you travel or work in places where you may be exposed to hepatitis B. Haemophilus influenzae type b (Hib) vaccine  You may need this if you have certain risk factors. Human papillomavirus (HPV) vaccine  If recommended by your health care provider, you may need three doses over 6 months. You may receive vaccines as individual doses or as more than one vaccine together in one shot (combination vaccines). Talk with your health care provider about the risks and benefits of combination vaccines. What tests do I need? Blood tests  Lipid and cholesterol levels. These may be checked every 5 years, or more frequently if you are over 20 years old.  Hepatitis C test.  Hepatitis B test. Screening  Lung cancer screening. You may have this screening every  year starting at age 71 if you have a 30-pack-year history of smoking and currently smoke or have quit within the past 15 years.  Prostate cancer screening. Recommendations will vary depending on your family history and other risks.  Colorectal cancer screening. All adults should have this screening starting at age 109 and continuing until age 50. Your  health care provider may recommend screening at age 67 if you are at increased risk. You will have tests every 1-10 years, depending on your results and the type of screening test.  Diabetes screening. This is done by checking your blood sugar (glucose) after you have not eaten for a while (fasting). You may have this done every 1-3 years.  Sexually transmitted disease (STD) testing. Follow these instructions at home: Eating and drinking  Eat a diet that includes fresh fruits and vegetables, whole grains, lean protein, and low-fat dairy products.  Take vitamin and mineral supplements as recommended by your health care provider.  Do not drink alcohol if your health care provider tells you not to drink.  If you drink alcohol: ? Limit how much you have to 0-2 drinks a day. ? Be aware of how much alcohol is in your drink. In the [place] one drink equals one 12 oz bottle of beer (355 mL), one 5 oz glass of wine (148 mL), or one 1 oz glass of hard liquor (44 mL). Lifestyle  Take daily care of your teeth and gums.  Stay active. Exercise for at least 30 minutes on 5 or more days each week.  Do not use any products that contain nicotine or tobacco, such as cigarettes, e-cigarettes, and chewing tobacco. If you need help quitting, ask your health care provider.  If you are sexually active, practice safe sex. Use a condom or other form of protection to prevent STIs (sexually transmitted infections).  Talk with your health care provider about taking a low-dose aspirin every day starting at age 62. What's next?  Go to your health care provider once a year for a well check visit.  Ask your health care provider how often you should have your eyes and teeth checked.  Stay up to date on all vaccines. This information is not intended to replace advice given to you by your health care provider. Make sure you discuss any questions you have with your health care provider. Document Released: 07/30/2015  Document Revised: 06/27/2018 Document Reviewed: 06/27/2018 Elsevier Patient Education  2020 [employer].

## 2019-06-27 ENCOUNTER — Telehealth: Payer: Self-pay | Admitting: Medical

## 2019-06-27 DIAGNOSIS — Z0189 Encounter for other specified special examinations: Secondary | ICD-10-CM

## 2019-06-27 NOTE — Telephone Encounter (Signed)
Referral placed.

## 2019-06-27 NOTE — Telephone Encounter (Signed)
Patient is calling to report to Philip Pearson that he is snoring at night. And is it possible to get the referral process for a sleep study?  [phone]

## 2019-07-09 ENCOUNTER — Other Ambulatory Visit: Payer: Self-pay

## 2019-07-09 ENCOUNTER — Encounter: Payer: Self-pay | Admitting: Neurology

## 2019-07-09 ENCOUNTER — Ambulatory Visit (INDEPENDENT_AMBULATORY_CARE_PROVIDER_SITE_OTHER): Payer: Commercial Managed Care - PPO | Admitting: Neurology

## 2019-07-09 VITALS — BP 142/96 | HR 82 | Temp 97.8°F | Ht 72.0 in | Wt 287.3 lb

## 2019-07-09 DIAGNOSIS — R351 Nocturia: Secondary | ICD-10-CM

## 2019-07-09 DIAGNOSIS — G4700 Insomnia, unspecified: Secondary | ICD-10-CM

## 2019-07-09 DIAGNOSIS — E669 Obesity, unspecified: Secondary | ICD-10-CM

## 2019-07-09 DIAGNOSIS — Z9189 Other specified personal risk factors, not elsewhere classified: Secondary | ICD-10-CM | POA: Diagnosis not present

## 2019-07-09 DIAGNOSIS — G479 Sleep disorder, unspecified: Secondary | ICD-10-CM

## 2019-07-09 DIAGNOSIS — R0683 Snoring: Secondary | ICD-10-CM | POA: Diagnosis not present

## 2019-07-09 DIAGNOSIS — R0681 Apnea, not elsewhere classified: Secondary | ICD-10-CM

## 2019-07-09 DIAGNOSIS — R635 Abnormal weight gain: Secondary | ICD-10-CM

## 2019-07-09 NOTE — Patient Instructions (Signed)

## 2019-07-09 NOTE — Progress Notes (Signed)
Subjective:    Patient ID: Philip Pearson is a 48 y.o. male.  HPI     Star Age, MD, PhD [facility] [address] [place], [place] [address]  Dear [name],   I saw your patient, [name], upon your kind request in my sleep clinic today for initial consultation of his sleep disorder, in particular, concern for underlying obstructive sleep apnea.  The patient is unaccompanied today.  As you know, [name] is a 48 year old right-handed gentleman with an underlying medical history of reflux disease, history of pancreatitis, Status post right shoulder rotator cuff repair on 03/18/2019, and obesity, who reports snoring and excessive daytime somnolence.  I reviewed your office note from 06/25/2019.  His Epworth sleepiness score is 4 out of 24, fatigue severity score is 19 out of 63.  He wakes up in the middle of the night.  He has been told he snores loudly.  He has a girlfriend, he lives alone, works for [employer], no children, no pets.  He is a non-smoker and does not drink alcohol and drinks caffeine in the form 1 or 2 energy drinks per day or 1 energy drink +1 coffee per day.  He does not drink any sodas otherwise, tries to keep good sleep hygiene, avoids watching TV in the bedroom, likes the bedroom cool, dark and quiet.  His snoring can be loud and has increased due to weight increase.  He injured his shoulder and required arthroscopic rotator cuff repair on 03/18/2019.  He is still in outpatient physical therapy and has made good progress.  When he was on a muscle relaxer and postoperatively also on narcotic pain medication he had significant sleep disturbance with sleep disruption and a feeling of gasping for air at night and has had the occasional witnessed apneas per girlfriend.  He is no longer on any narcotic pain medication and does not even take Tylenol on a regular basis for the past month.  He is on short-term disability.   His work requires significant heavy lifting of cases of bottles.  He works as a Education officer, community.  He is not aware of any family history of OSA.  He was able to lose weight earlier in the year but since he has been out of work after his injury, he has gained weight.  He is currently working on weight loss and has started to lose a little bit.  He has a bedtime between 1030 and 11 PM and rise time around 7.  He does have nocturia about 2-3 times per average night and denies any recurrent morning headaches or restless leg symptoms.  He has noticed that it is harder for him to breathe through his right nostril.  He denies any history of lung disease, asthma as a child even or recurrent tonsillitis or sinusitis.  He has taken melatonin in the recent past especially when he was not able to sleep well right after his surgery.  He had taken up to 10 mg of melatonin, currently is not on it.  His Past Medical History Is Significant For: Past Medical History:  Diagnosis Date  . GERD (gastroesophageal reflux disease)    given dx before pancreatitis.  . Medical history non-contributory   . Pancreatitis    No alcohol use and no gallbladder disease.    His Past Surgical History Is Significant For: Past Surgical History:  Procedure Laterality Date  . NO PAST SURGERIES    . SHOULDER ARTHROSCOPY  WITH ROTATOR CUFF REPAIR Right 03/18/2019   Procedure: SHOULDER ARTHROSCOPY WITH ROTATOR CUFF REPAIR;  Surgeon: Jones Broom, MD;  Location: [facility];  Service: Orthopedics;  Laterality: Right;  . SHOULDER ARTHROSCOPY WITH SUBACROMIAL DECOMPRESSION Right 03/18/2019   Procedure: SHOULDER ARTHROSCOPY WITH SUBACROMIAL DECOMPRESSION;  Surgeon: Jones Broom, MD;  Location: [facility];  Service: Orthopedics;  Laterality: Right;    His Family History Is Significant For: Family History  Problem Relation Age of Onset  . Emphysema Father   . Kidney disease Father   . Colon cancer Neg Hx    . Colon polyps Neg Hx   . Diabetes Neg Hx   . Esophageal cancer Neg Hx   . Heart disease Neg Hx   . Gallbladder disease Neg Hx     His Social History Is Significant For: Social History   Socioeconomic History  . Marital status: Divorced    Spouse name: Not on file  . Number of children: 0  . Years of education: Not on file  . Highest education level: Some college, no degree  Occupational History  . Occupation: Civil Service fast streamer  Tobacco Use  . Smoking status: Never Smoker  . Smokeless tobacco: Never Used  Substance and Sexual Activity  . Alcohol use: No    Alcohol/week: 0.0 standard drinks  . Drug use: No  . Sexual activity: Yes  Other Topics Concern  . Not on file  Social History Narrative   Lives at home alone   160-320 mg of caffeine per day    Social Determinants of Health   Financial Resource Strain:   . Difficulty of Paying Living Expenses: Not on file  Food Insecurity:   . Worried About Programme researcher, broadcasting/film/video in the Last Year: Not on file  . Ran Out of Food in the Last Year: Not on file  Transportation Needs:   . Lack of Transportation (Medical): Not on file  . Lack of Transportation (Non-Medical): Not on file  Physical Activity:   . Days of Exercise per Week: Not on file  . Minutes of Exercise per Session: Not on file  Stress:   . Feeling of Stress : Not on file  Social Connections:   . Frequency of Communication with Friends and Family: Not on file  . Frequency of Social Gatherings with Friends and Family: Not on file  . Attends Religious Services: Not on file  . Active Member of Clubs or Organizations: Not on file  . Attends Banker Meetings: Not on file  . Marital Status: Not on file    His Allergies Are:  Allergies  Allergen Reactions  . Codeine Nausea And Vomiting  :   His Current Medications Are:  Outpatient Encounter Medications as of 07/09/2019  Medication Sig  . Multiple Vitamin (MULTIVITAMIN) capsule Take 1 capsule by mouth  daily.  . Omega-3 Fatty Acids (FISH OIL PO) Take by mouth.   No facility-administered encounter medications on file as of 07/09/2019.  :  Review of Systems:  Out of a complete 14 point review of systems, all are reviewed and negative with the exception of these symptoms as listed below: Review of Systems  Neurological:       Here for sleep consult- no prior sleep study. Does report snoring also has dry mouth most mornings when he wakes up. He sts he wakes up 2 times during rest at night. Epworth Sleepiness Scale 0= would never doze 1= slight chance of dozing 2= moderate chance  of dozing 3= high chance of dozing  Sitting and reading:1 Watching TV:1 Sitting inactive in a public place (ex. Theater or meeting):0 As a passenger in a car for an hour without a break:0 Lying down to rest in the afternoon:2 Sitting and talking to someone:0 Sitting quietly after lunch (no alcohol):0 In a car, while stopped in traffic:0 Total:4     Objective:  Neurological Exam  Physical Exam Physical Examination:   Vitals:   07/09/19 1054  BP: (!) 142/96  Pulse: 82  Temp: 97.8 F (36.6 C)    General Examination: The patient is a very pleasant 48 y.o. male in no acute distress. He appears well-developed and well-nourished and well groomed.   HEENT: Normocephalic, atraumatic, pupils are equal, round and reactive to light, extraocular tracking is good without limitation to gaze excursion or nystagmus noted. Hearing is grossly intact. Face is symmetric with normal facial animation. Speech is clear with no dysarthria noted. There is no hypophonia. There is no lip, neck/head, jaw or voice tremor. Neck is supple with full range of passive and active motion. There are no carotid bruits on auscultation. Oropharynx exam reveals: mild mouth dryness, adequate dental hygiene and moderate airway crowding, due to Redundant soft palate, wider uvula, tonsils of about 1-2+ bilaterally.  Mallampati class III.  Neck  circumference is 19-3/8 inches.  He has a minimal overbite. Nasal inspection reveals a narrow right nasal passage, mild deviated septum with curvature to the right.   Chest: Clear to auscultation without wheezing, rhonchi or crackles noted.  Heart: S1+S2+0, regular and normal without murmurs, rubs or gallops noted.   Abdomen: Soft, non-tender and non-distended with normal bowel sounds appreciated on auscultation.  Extremities: There is no pitting edema in the distal lower extremities bilaterally.   Skin: Warm and dry without trophic changes noted.   Musculoskeletal: exam reveals no obvious joint deformities, tenderness or joint swelling or erythema. Good ROM R shoulder, slightly reduced Anterior flexion and lateral elevation of the arm.  Neurologically:  Mental status: The patient is awake, alert and oriented in all 4 spheres. His immediate and remote memory, attention, language skills and fund of knowledge are appropriate. There is no evidence of aphasia, agnosia, apraxia or anomia. Speech is clear with normal prosody and enunciation. Thought process is linear. Mood is normal and affect is normal.  Cranial nerves II - XII are as described above under HEENT exam.  Motor exam: Normal bulk, strength and tone is noted. There is no tremor, Romberg is negative. Reflexes are 2+ throughout. Fine motor skills and coordination: grossly intact.  Cerebellar testing: No dysmetria or intention tremor. There is no truncal or gait ataxia.  Sensory exam: intact to light touch in the upper and lower extremities.  Gait, station and balance: He stands easily. No veering to one side is noted. No leaning to one side is noted. Posture is age-appropriate and stance is narrow based. Gait shows normal stride length and normal pace. No problems turning are noted. Tandem walk is unremarkable.                Assessment and Plan:  In summary, Darlyn ReadCharles Koslow is a very pleasant 48 y.o.-year old male with an underlying  medical history of reflux disease, history of pancreatitis, Status post right shoulder rotator cuff repair on 03/18/2019, and obesity, whose history and physical exam are concerning for obstructive sleep apnea (OSA). I had a long chat with the patient about my findings and the diagnosis of OSA, its  prognosis and treatment options. We talked about medical treatments, surgical interventions and non-pharmacological approaches. I explained in particular the risks and ramifications of untreated moderate to severe OSA, especially with respect to developing cardiovascular disease down the Road, including congestive heart failure, difficult to treat hypertension, cardiac arrhythmias, or stroke. Even type 2 diabetes has, in part, been linked to untreated OSA. Symptoms of untreated OSA include daytime sleepiness, memory problems, mood irritability and mood disorder such as depression and anxiety, lack of energy, as well as recurrent headaches, especially morning headaches. We talked about trying to maintain a healthy lifestyle in general, as well as the importance of weight control. We also talked about the importance of good sleep hygiene. I recommended the following at this time: sleep study.  I explained the sleep test procedure to the patient and also outlined possible surgical and non-surgical treatment options of OSA, including the use of a custom-made dental device (which would require a referral to a specialist dentist or oral surgeon), upper airway surgical options, such as traditional UPPP or a novel less invasive surgical option in the form of Inspire hypoglossal nerve stimulation (which would involve a referral to an ENT surgeon). I also explained the CPAP treatment option to the patient, who indicated that he would be willing to try CPAP if the need arises. I explained the importance of being compliant with PAP treatment, not only for insurance purposes but primarily to improve His symptoms, and for the  patient's long term health benefit, including to reduce His cardiovascular risks. I answered all his questions today and the patient was in agreement. I plan to see him back after the sleep study is completed and encouraged him to call with any interim questions, concerns, problems or updates.   Thank you very much for allowing me to participate in the care of this nice patient. If I can be of any further assistance to you please do not hesitate to call me at [phone].  Sincerely,   Huston Foley, MD, PhD

## 2019-07-30 ENCOUNTER — Other Ambulatory Visit: Payer: Self-pay

## 2019-07-30 ENCOUNTER — Ambulatory Visit: Payer: Commercial Managed Care - PPO | Admitting: Neurology

## 2019-07-31 ENCOUNTER — Ambulatory Visit (INDEPENDENT_AMBULATORY_CARE_PROVIDER_SITE_OTHER): Payer: Commercial Managed Care - PPO | Admitting: Neurology

## 2019-07-31 ENCOUNTER — Other Ambulatory Visit: Payer: Self-pay

## 2019-07-31 DIAGNOSIS — G4700 Insomnia, unspecified: Secondary | ICD-10-CM

## 2019-07-31 DIAGNOSIS — Z9189 Other specified personal risk factors, not elsewhere classified: Secondary | ICD-10-CM

## 2019-07-31 DIAGNOSIS — E669 Obesity, unspecified: Secondary | ICD-10-CM

## 2019-07-31 DIAGNOSIS — G4733 Obstructive sleep apnea (adult) (pediatric): Secondary | ICD-10-CM

## 2019-07-31 DIAGNOSIS — G4720 Circadian rhythm sleep disorder, unspecified type: Secondary | ICD-10-CM

## 2019-07-31 DIAGNOSIS — R635 Abnormal weight gain: Secondary | ICD-10-CM

## 2019-07-31 DIAGNOSIS — R0683 Snoring: Secondary | ICD-10-CM

## 2019-07-31 DIAGNOSIS — R351 Nocturia: Secondary | ICD-10-CM

## 2019-07-31 DIAGNOSIS — G479 Sleep disorder, unspecified: Secondary | ICD-10-CM

## 2019-07-31 DIAGNOSIS — R0681 Apnea, not elsewhere classified: Secondary | ICD-10-CM

## 2019-08-04 NOTE — Progress Notes (Signed)
Patient referred by Consuelo Pandy, PA, seen by me on 07/09/19, diagnostic PSG on 07/31/19.    Please call and notify the patient that the recent sleep study showed severe OSA. While I recommend, he come in for a CPAP titration study, his insurance will not approve a second sleep study for titration. Therefore, I recommend, we start him on autoPAP, which means, that we don't have to bring him back for a second sleep study with CPAP, but will let him try an autoPAP machine at home, through a DME company (of his choice, or as per insurance requirement). The DME representative will educate him on how to use the machine, how to put the mask on, etc. I have placed an order in the chart. Please send referral, talk to patient, send report to referring MD. We will need a FU in sleep clinic for 10 weeks post-PAP set up, please arrange that with me or one of our NPs. Thanks,   Huston Foley, MD, PhD [facility])

## 2019-08-04 NOTE — Procedures (Signed)
PATIENT'S NAME:  Alexy, Heldt DOB:      October 31, 1970      MR#:    [id number]     DATE OF RECORDING: 07/31/2019 REFERRING M.D.:  Mackie Pai, PA-C Study Performed:   Baseline Polysomnogram HISTORY: 49 year old man with an underlying medical history of reflux disease, history of pancreatitis, Status post right shoulder rotator cuff repair on 03/18/2019, and obesity, who reports snoring and excessive daytime somnolence. The patient endorsed the Epworth Sleepiness Scale at 4 points. The patient's weight 287 pounds with a height of 72 (inches), resulting in a BMI of 38.8 kg/m2. The patient's neck circumference measured 20 inches.  CURRENT MEDICATIONS: Multivitamin, Fish Oil   PROCEDURE:  This is a multichannel digital polysomnogram utilizing the Somnostar 11.2 system.  Electrodes and sensors were applied and monitored per AASM Specifications.   EEG, EOG, Chin and Limb EMG, were sampled at 200 Hz.  ECG, Snore and Nasal Pressure, Thermal Airflow, Respiratory Effort, CPAP Flow and Pressure, Oximetry was sampled at 50 Hz. Digital video and audio were recorded.      BASELINE STUDY  Lights Out was at 22:18 and Lights On at 04:18.  Total recording time (TRT) was 360.5 minutes, with a total sleep time (TST) of 195.5 minutes.   The patient's sleep latency to persistent sleep was 38.5 minutes.  REM latency was 283.5 minutes, which is markedly delayed. The sleep efficiency was 54.2%, which is markedly reduced.     SLEEP ARCHITECTURE: WASO (Wake after sleep onset) was 116 minutes with moderate to severe sleep fragmentation noted. There were 35.5 minutes in Stage N1, 133 minutes Stage N2, 23 minutes Stage N3 and 4 minutes in Stage REM.  The percentage of Stage N1 was 18.2%, which is markedly increased, Stage N2 was 68.%, which is increased, Stage N3 was 11.8% and Stage R (REM sleep) was 2.%, which is markedly reduced. The arousals were noted as: 43 were spontaneous, 0 were associated with PLMs, 75 were associated  with respiratory events.  RESPIRATORY ANALYSIS:  There were a total of 195 respiratory events:  13 obstructive apneas, 0 central apneas and 0 mixed apneas with a total of 13 apneas and an apnea index (AI) of 4. /hour. There were 182 hypopneas with a hypopnea index of 55.9 /hour. The patient also had 0 respiratory event related arousals (RERAs).      The total APNEA/HYPOPNEA INDEX (AHI) was 59.8/hour and the total RESPIRATORY DISTURBANCE INDEX was  59.8 /hour.  7 events occurred in REM sleep and 350 events in NREM. The REM AHI was  105 /hour, versus a non-REM AHI of 58.9. The patient spent 26 minutes of total sleep time in the supine position and 170 minutes in non-supine.. The supine AHI was 78.5 versus a non-supine AHI of 57.0.  OXYGEN SATURATION & C02:  The Wake baseline 02 saturation was 94%, with the lowest being 82%. Time spent below 89% saturation equaled 39 minutes. PERIODIC LIMB MOVEMENTS:   The patient had a total of 0 Periodic Limb Movements.  The Periodic Limb Movement (PLM) index was 0 and the PLM Arousal index was 0/hour.  Audio and video analysis did not show any abnormal or unusual movements, behaviors, phonations or vocalizations. The patient took 3 bathroom breaks. Moderate snoring was noted. The EKG was in keeping with normal sinus rhythm (NSR).  Post-study, the patient indicated that sleep was better than usual.   IMPRESSION:  1. Obstructive Sleep Apnea (OSA) 2. Dysfunctions associated with sleep stages or arousal from sleep  RECOMMENDATIONS:  1. This sleep study demonstrates severe obstructive sleep apnea with a total AHI of 59.8/hour, REM AHI of 105/h, supine AHI of 78.5/h and O2 nadir of 82%. Treatment with positive airway pressure (in the form of CPAP) is recommended. This will require a full night CPAP titration study for proper treatment settings, O2 monitoring and mask fitting. Based on the severity of the sleep disordered breathing an attended titration study is  indicated. However, patient's insurance has denied an attended sleep study for PAP titration; therefore, the patient will be advised to proceed with an autoPAP titration/trial at home for now. Please note that untreated obstructive sleep apnea may carry additional perioperative morbidity. Patients with significant obstructive sleep apnea should receive perioperative PAP therapy and the surgeons and particularly the anesthesiologist should be informed of the diagnosis and the severity of the sleep disordered breathing. 2. The patient should be cautioned not to drive, work at heights, or operate dangerous or heavy equipment when tired or sleepy. Review and reiteration of good sleep hygiene measures should be pursued with any patient. 3. This study shows sleep fragmentation and abnormal sleep stage percentages; these are nonspecific findings and per se do not signify an intrinsic sleep disorder or a cause for the patient's sleep-related symptoms. Causes include (but are not limited to) the first night effect of the sleep study, circadian rhythm disturbances, medication effect or an underlying mood disorder or medical problem.  4. The patient will be seen in follow-up in the sleep clinic at [facility] for discussion of the test results, symptom and treatment compliance review, further management strategies, etc. The referring provider will be notified of the test results.  I certify that I have reviewed the entire raw data recording prior to the issuance of this report in accordance with the Standards of Accreditation of the American Academy of Sleep Medicine (AASM)   Huston Foley, MD, PhD Diplomat, American Board of Neurology and Sleep Medicine (Neurology and Sleep Medicine)

## 2019-08-04 NOTE — Addendum Note (Signed)
Addended by: Huston Foley on: 08/04/2019 08:13 AM   Modules accepted: Orders

## 2019-08-05 NOTE — Progress Notes (Signed)
08/05/2019 my chart message sent.

## 2019-08-06 ENCOUNTER — Telehealth: Payer: Self-pay

## 2019-08-06 NOTE — Telephone Encounter (Signed)
I reached out to the pt and advised of results. Verbalized understanding and was agreeable to starting the autopap.   Pt will use [employer] as his DME.   Pt's f/u has been made for 10/22/2019 and letter will be made with f/u information on results.

## 2019-08-06 NOTE — Telephone Encounter (Signed)
-----   Message from Huston Foley, MD sent at 08/04/2019  8:13 AM EST ----- Patient referred by Consuelo Pandy, PA, seen by me on 07/09/19, diagnostic PSG on 07/31/19.    Please call and notify the patient that the recent sleep study showed severe OSA. While I recommend, he come in for a CPAP titration study, his insurance will not approve a second sleep study for titration. Therefore, I recommend, we start him on autoPAP, which means, that we don't have to bring him back for a second sleep study with CPAP, but will let him try an autoPAP machine at home, through a DME company (of his choice, or as per insurance requirement). The DME representative will educate him on how to use the machine, how to put the mask on, etc. I have placed an order in the chart. Please send referral, talk to patient, send report to referring MD. We will need a FU in sleep clinic for 10 weeks post-PAP set up, please arrange that with me or one of our NPs. Thanks,   Huston Foley, MD, PhD [facility])

## 2019-08-21 ENCOUNTER — Encounter: Payer: Self-pay | Admitting: Medical

## 2019-10-22 ENCOUNTER — Ambulatory Visit: Payer: Self-pay | Admitting: Neurology
